# Patient Record
Sex: Female | Born: 1980 | Hispanic: Yes | Marital: Single | Smoking: Never smoker
Health system: Southern US, Community
[De-identification: ages and names within clinical notes are randomized; demographics above are authoritative.]

## PROBLEM LIST (undated history)

## (undated) ENCOUNTER — Inpatient Hospital Stay (HOSPITAL_COMMUNITY): Payer: Self-pay

## (undated) DIAGNOSIS — F419 Anxiety disorder, unspecified: Secondary | ICD-10-CM

## (undated) DIAGNOSIS — O4290 Premature rupture of membranes, unspecified as to length of time between rupture and onset of labor, unspecified weeks of gestation: Secondary | ICD-10-CM

## (undated) DIAGNOSIS — E079 Disorder of thyroid, unspecified: Secondary | ICD-10-CM

## (undated) DIAGNOSIS — R87619 Unspecified abnormal cytological findings in specimens from cervix uteri: Secondary | ICD-10-CM

## (undated) DIAGNOSIS — IMO0002 Reserved for concepts with insufficient information to code with codable children: Secondary | ICD-10-CM

## (undated) DIAGNOSIS — O09299 Supervision of pregnancy with other poor reproductive or obstetric history, unspecified trimester: Secondary | ICD-10-CM

## (undated) HISTORY — DX: Unspecified abnormal cytological findings in specimens from cervix uteri: R87.619

## (undated) HISTORY — DX: Premature rupture of membranes, unspecified as to length of time between rupture and onset of labor, unspecified weeks of gestation: O42.90

## (undated) HISTORY — DX: Reserved for concepts with insufficient information to code with codable children: IMO0002

## (undated) HISTORY — PX: WISDOM TOOTH EXTRACTION: SHX21

## (undated) HISTORY — DX: Supervision of pregnancy with other poor reproductive or obstetric history, unspecified trimester: O09.299

## (undated) HISTORY — PX: OTHER SURGICAL HISTORY: SHX169

## (undated) HISTORY — DX: Disorder of thyroid, unspecified: E07.9

---

## 2002-09-23 ENCOUNTER — Ambulatory Visit (HOSPITAL_COMMUNITY): Admission: RE | Admit: 2002-09-23 | Discharge: 2002-09-23 | Payer: Self-pay | Admitting: *Deleted

## 2002-10-03 DIAGNOSIS — O4290 Premature rupture of membranes, unspecified as to length of time between rupture and onset of labor, unspecified weeks of gestation: Secondary | ICD-10-CM

## 2002-10-03 DIAGNOSIS — O09299 Supervision of pregnancy with other poor reproductive or obstetric history, unspecified trimester: Secondary | ICD-10-CM

## 2002-10-03 HISTORY — DX: Supervision of pregnancy with other poor reproductive or obstetric history, unspecified trimester: O09.299

## 2002-10-03 HISTORY — DX: Premature rupture of membranes, unspecified as to length of time between rupture and onset of labor, unspecified weeks of gestation: O42.90

## 2002-11-14 ENCOUNTER — Inpatient Hospital Stay (HOSPITAL_COMMUNITY): Admission: AD | Admit: 2002-11-14 | Discharge: 2002-11-14 | Payer: Self-pay | Admitting: *Deleted

## 2002-12-31 ENCOUNTER — Ambulatory Visit (HOSPITAL_COMMUNITY): Admission: RE | Admit: 2002-12-31 | Discharge: 2002-12-31 | Payer: Self-pay | Admitting: *Deleted

## 2003-01-02 ENCOUNTER — Inpatient Hospital Stay (HOSPITAL_COMMUNITY): Admission: AD | Admit: 2003-01-02 | Discharge: 2003-01-02 | Payer: Self-pay | Admitting: Family Medicine

## 2003-01-23 ENCOUNTER — Observation Stay (HOSPITAL_COMMUNITY): Admission: AD | Admit: 2003-01-23 | Discharge: 2003-01-23 | Payer: Self-pay | Admitting: *Deleted

## 2003-01-24 ENCOUNTER — Encounter: Payer: Self-pay | Admitting: Family Medicine

## 2003-01-24 ENCOUNTER — Inpatient Hospital Stay (HOSPITAL_COMMUNITY): Admission: AD | Admit: 2003-01-24 | Discharge: 2003-01-24 | Payer: Self-pay | Admitting: Obstetrics and Gynecology

## 2003-01-27 ENCOUNTER — Inpatient Hospital Stay (HOSPITAL_COMMUNITY): Admission: AD | Admit: 2003-01-27 | Discharge: 2003-01-27 | Payer: Self-pay | Admitting: Obstetrics and Gynecology

## 2003-01-29 ENCOUNTER — Inpatient Hospital Stay (HOSPITAL_COMMUNITY): Admission: AD | Admit: 2003-01-29 | Discharge: 2003-02-03 | Payer: Self-pay | Admitting: Obstetrics and Gynecology

## 2003-01-29 ENCOUNTER — Encounter: Admission: RE | Admit: 2003-01-29 | Discharge: 2003-01-29 | Payer: Self-pay | Admitting: *Deleted

## 2003-02-05 ENCOUNTER — Encounter (INDEPENDENT_AMBULATORY_CARE_PROVIDER_SITE_OTHER): Payer: Self-pay | Admitting: *Deleted

## 2003-02-05 ENCOUNTER — Observation Stay (HOSPITAL_COMMUNITY): Admission: AD | Admit: 2003-02-05 | Discharge: 2003-02-06 | Payer: Self-pay | Admitting: *Deleted

## 2003-02-18 ENCOUNTER — Encounter: Admission: RE | Admit: 2003-02-18 | Discharge: 2003-02-18 | Payer: Self-pay | Admitting: Obstetrics and Gynecology

## 2003-03-29 ENCOUNTER — Emergency Department (HOSPITAL_COMMUNITY): Admission: EM | Admit: 2003-03-29 | Discharge: 2003-03-29 | Payer: Self-pay | Admitting: Emergency Medicine

## 2003-04-04 ENCOUNTER — Inpatient Hospital Stay (HOSPITAL_COMMUNITY): Admission: AD | Admit: 2003-04-04 | Discharge: 2003-04-04 | Payer: Self-pay | Admitting: Family Medicine

## 2004-10-01 ENCOUNTER — Inpatient Hospital Stay (HOSPITAL_COMMUNITY): Admission: AD | Admit: 2004-10-01 | Discharge: 2004-10-01 | Payer: Self-pay | Admitting: Obstetrics and Gynecology

## 2005-02-04 ENCOUNTER — Inpatient Hospital Stay (HOSPITAL_COMMUNITY): Admission: AD | Admit: 2005-02-04 | Discharge: 2005-02-04 | Payer: Self-pay | Admitting: Obstetrics and Gynecology

## 2005-02-17 ENCOUNTER — Inpatient Hospital Stay (HOSPITAL_COMMUNITY): Admission: AD | Admit: 2005-02-17 | Discharge: 2005-02-19 | Payer: Self-pay | Admitting: Obstetrics & Gynecology

## 2005-03-22 ENCOUNTER — Other Ambulatory Visit: Admission: RE | Admit: 2005-03-22 | Discharge: 2005-03-22 | Payer: Self-pay | Admitting: Obstetrics & Gynecology

## 2008-09-04 ENCOUNTER — Emergency Department (HOSPITAL_COMMUNITY): Admission: EM | Admit: 2008-09-04 | Discharge: 2008-09-04 | Payer: Self-pay | Admitting: Family Medicine

## 2009-08-22 ENCOUNTER — Inpatient Hospital Stay (HOSPITAL_COMMUNITY): Admission: AD | Admit: 2009-08-22 | Discharge: 2009-08-23 | Payer: Self-pay | Admitting: Obstetrics & Gynecology

## 2010-01-06 ENCOUNTER — Ambulatory Visit: Payer: Self-pay | Admitting: Obstetrics & Gynecology

## 2010-01-08 ENCOUNTER — Ambulatory Visit (HOSPITAL_COMMUNITY): Admission: RE | Admit: 2010-01-08 | Discharge: 2010-01-08 | Payer: Self-pay | Admitting: Family Medicine

## 2010-01-27 ENCOUNTER — Ambulatory Visit: Payer: Self-pay | Admitting: Obstetrics & Gynecology

## 2010-05-03 ENCOUNTER — Ambulatory Visit: Payer: Self-pay | Admitting: Internal Medicine

## 2010-05-14 ENCOUNTER — Encounter (INDEPENDENT_AMBULATORY_CARE_PROVIDER_SITE_OTHER): Payer: Self-pay | Admitting: Family Medicine

## 2010-05-14 ENCOUNTER — Ambulatory Visit: Payer: Self-pay | Admitting: Internal Medicine

## 2010-05-14 LAB — CONVERTED CEMR LAB
ALT: 15 units/L (ref 0–35)
Albumin: 4.6 g/dL (ref 3.5–5.2)
Basophils Absolute: 0 10*3/uL (ref 0.0–0.1)
CO2: 27 meq/L (ref 19–32)
Cholesterol: 186 mg/dL (ref 0–200)
Eosinophils Relative: 2 % (ref 0–5)
Glucose, Bld: 85 mg/dL (ref 70–99)
HCT: 41 % (ref 36.0–46.0)
LDL Cholesterol: 109 mg/dL — ABNORMAL HIGH (ref 0–99)
Lymphocytes Relative: 37 % (ref 12–46)
Lymphs Abs: 1.2 10*3/uL (ref 0.7–4.0)
Neutro Abs: 1.7 10*3/uL (ref 1.7–7.7)
Neutrophils Relative %: 51 % (ref 43–77)
Platelets: 255 10*3/uL (ref 150–400)
Potassium: 4.7 meq/L (ref 3.5–5.3)
RDW: 14.5 % (ref 11.5–15.5)
Sodium: 141 meq/L (ref 135–145)
Total Bilirubin: 0.4 mg/dL (ref 0.3–1.2)
Total Protein: 7.6 g/dL (ref 6.0–8.3)
Triglycerides: 49 mg/dL (ref <150)
VLDL: 10 mg/dL (ref 0–40)
WBC: 3.4 10*3/uL — ABNORMAL LOW (ref 4.0–10.5)

## 2010-08-20 ENCOUNTER — Ambulatory Visit: Payer: Self-pay | Admitting: Obstetrics and Gynecology

## 2010-10-24 ENCOUNTER — Encounter: Payer: Self-pay | Admitting: Obstetrics & Gynecology

## 2010-12-14 LAB — POCT URINALYSIS DIPSTICK
Bilirubin Urine: NEGATIVE
Glucose, UA: NEGATIVE mg/dL
Nitrite: NEGATIVE

## 2010-12-29 ENCOUNTER — Other Ambulatory Visit: Payer: Self-pay

## 2010-12-29 ENCOUNTER — Other Ambulatory Visit: Payer: Self-pay | Admitting: Obstetrics and Gynecology

## 2010-12-29 DIAGNOSIS — Z0189 Encounter for other specified special examinations: Secondary | ICD-10-CM

## 2010-12-29 LAB — POCT URINALYSIS DIP (DEVICE)
Bilirubin Urine: NEGATIVE
Ketones, ur: NEGATIVE mg/dL
Nitrite: NEGATIVE
Specific Gravity, Urine: 1.025 (ref 1.005–1.030)

## 2010-12-30 ENCOUNTER — Encounter: Payer: Self-pay | Admitting: *Deleted

## 2011-01-05 LAB — URINALYSIS, ROUTINE W REFLEX MICROSCOPIC
Bilirubin Urine: NEGATIVE
Hgb urine dipstick: NEGATIVE
Ketones, ur: NEGATIVE mg/dL
Protein, ur: NEGATIVE mg/dL
Urobilinogen, UA: 1 mg/dL (ref 0.0–1.0)

## 2011-01-05 LAB — CBC
HCT: 36.8 % (ref 36.0–46.0)
Hemoglobin: 12.5 g/dL (ref 12.0–15.0)
MCHC: 34 g/dL (ref 30.0–36.0)
MCV: 90.5 fL (ref 78.0–100.0)
Platelets: 204 10*3/uL (ref 150–400)
RDW: 13.6 % (ref 11.5–15.5)

## 2011-01-05 LAB — WET PREP, GENITAL
Clue Cells Wet Prep HPF POC: NONE SEEN
Trich, Wet Prep: NONE SEEN

## 2011-01-12 ENCOUNTER — Ambulatory Visit: Payer: Self-pay | Admitting: Obstetrics and Gynecology

## 2011-01-31 ENCOUNTER — Ambulatory Visit (INDEPENDENT_AMBULATORY_CARE_PROVIDER_SITE_OTHER): Payer: Self-pay | Admitting: Physician Assistant

## 2011-01-31 DIAGNOSIS — Z113 Encounter for screening for infections with a predominantly sexual mode of transmission: Secondary | ICD-10-CM

## 2011-02-01 NOTE — Group Therapy Note (Signed)
Kathleen Ward, Kathleen Ward NO.:  [id number]  MEDICAL RECORD NO.:  [id number]           PATIENT TYPE:  A  LOCATION:  [facility]                   FACILITY:  [facility]  PHYSICIAN:  Maylon Cos, CNM    DATE OF BIRTH:  [birth year]  DATE OF SERVICE:  01/31/2011                                 CLINIC NOTE  The patient is seen at [facility] at [facility]  Reason for today's visit is pelvic pain.  HISTORY OF PRESENT ILLNESS:  The patient is a 30 year old [place]- speaking Hispanic female who presents for followup secondary to she had recently called and presented to give urine specimen due to burning with urination and bleeding after voiding, specimen was collected on December 29, 2010, however, urine culture came back negative.  She was presumptively treated at the time of her visit with Bactrim.  She states that after 5 days of Bactrim her symptoms completely resolved and she is not having any pain or abnormal bleeding since.  She presents today without complaint.  Upon further examination and questioning, the patient does desire screening for sexually transmitted infections.  PHYSICAL EXAMINATION:  GENERAL:  Ms. Zuleta is a pleasant 30 year old Hispanic female in no apparent stress. HEENT:  Grossly normal with the exception of facial acne. VITAL SIGNS:  Temperature is 97.6, pulse is 74, blood pressure is 115/83, her weight is 120.6.  Her height is 60 inches. ABDOMEN:  Soft and nontender. GENITALIA:  She has a shaved perineum without lesions.  Mucous membranes are pink.  She has a scant amount of tanned pink tinged discharge noted in the vault watering without odor.  Cervix is nonfriable.  Bimanual exam reveals nonenlarged, nontender uterus and not enlarged, nontender adnexa.  No inguinal lymphadenopathy is noted. LOWER EXTREMITIES:  Without edema.  ASSESSMENT: 1. Pelvic pain now resolved. 2. STI screening.  PLAN:  Gonorrhea, Chlamydia, and wet prep were obtained  along with blood work for hepatitis, RPR and HIV.  The patient is instructed a clinic follow up plan of no news is good news, she can call for her own results after Wednesday if she desires.  The patient should return in December 2012 for her annual exam.  The patient verbalizes understanding and agreement in our plan.         ______________________________ Maylon Cos, CNM   SS/MEDQ  D:  01/31/2011  T:  02/01/2011  Job:  [id number]

## 2011-02-18 NOTE — Discharge Summary (Signed)
NAMEMEGGIN, OLA NO.:  [id number]   MEDICAL RECORD NO.:  [id number]          PATIENT TYPE:  INP   LOCATION:  9111                          [facility]ILITY[facility]   PHYSICIAN:  Malva Limes, M.D.    DATE OF BIRTH:  August 01, 1981   DATE OF ADMISSION:  02/17/2005  DATE OF DISCHARGE:  02/19/2005                                 DISCHARGE SUMMARY   DISCHARGE DIAGNOSES:  1.  Intrauterine pregnancy at 38-3/[redacted] weeks gestation.  2.  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  3.  Vacuum assisted vaginal delivery of a female infant with Apgars of 9 and      9.   HISTORY OF PRESENT ILLNESS:  This 30 year old gravida 2, para 1, presents to  [facility] in active labor.  The patient's antepartum course to this  point had been complicated by a 19-week ultrasound which showed absence of  the left kidney on fetal ultrasound, otherwise, her antepartum had been  uncomplicated.  She did have a negative Group B Strep culture obtained in  the office at 35 weeks.   HOSPITAL COURSE:  She is admitted at this point.  The patient dilated to  complete and complete. She had a vacuum assisted vaginal delivery of a 7  pound 0 ounce female infant with Apgars of 9 and 9.  Delivery went without  complications.  The patient's postpartum course was benign without  significant fevers.  She did have her little boy circumcised before  discharge.  She was felt ready for discharge on postpartum day #2, was sent  home on a regular diet, told to decrease activities, told to continue  prenatal vitamins.  Was given Percocet one to two every 4 hours as needed  for pain.  She was to follow up in the office in 4 weeks, of course to call  with any increased bleeding, fever, or pain.   DISCHARGE LABORATORY DATA:  The patient had a hemoglobin of 10.8, white  blood cell count of 9.4, and platelets of 225,000.      Belenda Cruise   MB/MEDQ  D:  03/21/2005  T:   03/21/2005  Job:  [id number]

## 2011-02-18 NOTE — Op Note (Signed)
   [name], Ward NO.:  [id number]   MEDICAL RECORD NO.:  [id number]                   PATIENT TYPE:  OBV   LOCATION:  9327                                 [facility]   PHYSICIAN:  Phil D. Okey Dupre, M.D.                  DATE OF BIRTH:  1981/02/03   DATE OF PROCEDURE:  02/05/2003  DATE OF DISCHARGE:                                 OPERATIVE REPORT   PROCEDURE:  Dilatation and curettage.   SURGEON:  Javier Glazier. Okey Dupre, M.D.   PREOPERATIVE DIAGNOSES:  Delayed postpartum hemorrhage.   POSTOPERATIVE DIAGNOSES:  Probable subinvolution of the placental site.   PROCEDURE:  Under satisfactory sedation with the patient in a dorsal  lithotomy position the perineum and vagina were prepped and draped in the  usual sterile manner.  Bimanual pelvic examination revealed a firm, well  involuted uterus to approximately [redacted] weeks gestational size.  A weighted  speculum was placed in the posterior fourchette of the vagina.  Anterior lip  of the cervix grasped with the ring forceps and the uterine cavity sounded  to a depth of 15 cm.  Cervical os was already dilated up beyond the number  10 Hegar dilator.  Uterine cavity was explored with a ring forceps followed  by curettage with a large sharp curette and a suction curette.  A lot of  blood and clots were removed from the cavity; however, no large pieces of  retained secundines could be noted but there was a significant decrease in  the bleeding once this was done.  The speculum was removed and a ring  forceps removed from the vagina and shortly thereafter it was noted at the  fourchette of the vagina at the episiotomy site there was a significant ooze  where the weighted speculum had been on the episiotomy and a figure-of-eight  suture was used to control this bleeding without incident.  The total blood  loss during the procedure was about 200 mL.  The patient tolerated the  procedure well and was transferred to  recovery room.  Prior to the procedure  10 mL of Xylocaine was injected into each of the paracervical areas at 8 and  4 o'clock as a paracervical block.                                                 Phil D. Okey Dupre, M.D.    PDR/MEDQ  D:  02/05/2003  T:  02/05/2003  Job:  [id number]

## 2011-02-18 NOTE — Discharge Summary (Signed)
Kathleen Ward, Kathleen Ward   MEDICAL RECORD NO.:  [id number]                   PATIENT TYPE:  INP   LOCATION:  9303                                 [facility]   PHYSICIAN:  Barney Drain, M.D.                 DATE OF BIRTH:  03-26-1981   DATE OF ADMISSION:  01/29/2003  DATE OF DISCHARGE:  02/03/2003                                 DISCHARGE SUMMARY   CONSULTANTS:  Dorinda Hill T. Pamalee Leyden, M.D., anesthesiology.   DISCHARGE DIAGNOSES:  1. Term female delivered by vacuum assisted vaginal delivery.  2. Pregnancy induced hypertension.   DISCHARGE MEDICATIONS:  1. Micronor - patient instructed to begin taking in two weeks to use while     breast feeding.  2. Prenatal vitamins one tablet daily while breast feeding.  3. Ibuprofen 600 mg q.6h p.r.n. for discomfort.   SPECIAL INSTRUCTIONS:  No intercourse for six weeks.   DISPOSITION:  The patient was discharged home in stable condition.   FOLLOW UP:  The patient to return in [facility] in six weeks.   HISTORY AND PHYSICAL:  Kathleen Ward is a 30 year old G2, P0, 0, 1, 0 who  presented at 38-5/7 weeks based on 20-week ultrasound.  She was referred  from [facility] for induction of labor for pregnancy induced  hypertension.  The patient denied any symptoms of preeclampsia.  VITAL SIGNS: Blood pressure 135/79.  GENERAL: The patient was comfortable.  NEUROLOGIC EXAM: Nonfocal with no clonus.  EXTREMITIES: Trace lower extremity edema.  DIGITAL CERVICAL EXAM: Was 2 cm, 50% effaced, -1 to 2.  ABDOMEN: Fetal heart rate was 140 beats per minute with a reactive strip and  good variability.  No decelerations.  The patient was contracting every one  to three minutes of mild intensity.   PERTINENT LABORATORY DATA:  Ultrasound from 01/29/03 with a biophysical  profile score of 8/8 with AFI of 5.9 and vertex position.  Uric acid was 7.2  on 4/26.  LDH was 169 on 4/26.   HOSPITAL COURSE:  PROBLEM  1.  Intrauterine pregnancy.  The patient's labor  was induced with Cervidil and augmented with Pitocin.  At 1913 Hr. On  01/31/03 the patient delivered a viable female infant by vacuum assisted  vaginal delivery with Apgar's of 8 and 9 under epidural anesthesia.  A  midline episiotomy with a second degree laceration was repaired with 3-0  suture.  The patient's postpartum course was only complicated by some right  thigh numbness.  Anesthesiology was consulted, Dr. Pamalee Leyden evaluated the  patient and felt that this was a benign neuralgia secondary to pregnancy  induced edema.  PROBLEM 2.  Term female infant.  The patient plans to breast feed, lactation  consultant works with patient prior to discharge.  PROBLEM 3.  Family planning.  The patient to use Micronor while breast  feeding.  PROBLEM 4.  Pregnancy induced hypertension.  The patient was begun on  magnesium on 01/31/03, was weaned off of magnesium on 02/02/03.  Blood  pressures remained within normal range. Labs were negative at time of  discharge and PIH resolved at time of discharge.                                               Barney Drain, M.D.    SS/MEDQ  D:  02/03/2003  T:  02/04/2003  Job:  [id number]

## 2011-03-05 ENCOUNTER — Ambulatory Visit (INDEPENDENT_AMBULATORY_CARE_PROVIDER_SITE_OTHER): Payer: Self-pay

## 2011-03-05 ENCOUNTER — Inpatient Hospital Stay (INDEPENDENT_AMBULATORY_CARE_PROVIDER_SITE_OTHER)
Admission: RE | Admit: 2011-03-05 | Discharge: 2011-03-05 | Disposition: A | Discharge status: Home / Self Care | Payer: Self-pay | Source: Ambulatory Visit | Attending: Family Medicine | Admitting: Family Medicine

## 2011-03-05 DIAGNOSIS — K5900 Constipation, unspecified: Secondary | ICD-10-CM

## 2011-03-05 LAB — OCCULT BLOOD, POC DEVICE: Fecal Occult Bld: POSITIVE

## 2011-03-05 LAB — POCT PREGNANCY, URINE: Preg Test, Ur: NEGATIVE

## 2011-03-05 LAB — POCT URINALYSIS DIP (DEVICE)
Bilirubin Urine: NEGATIVE
Ketones, ur: NEGATIVE mg/dL
Nitrite: NEGATIVE

## 2011-03-07 LAB — OCCULT BLOOD, POC DEVICE

## 2011-08-17 ENCOUNTER — Encounter: Payer: Self-pay | Admitting: *Deleted

## 2011-08-17 DIAGNOSIS — R87612 Low grade squamous intraepithelial lesion on cytologic smear of cervix (LGSIL): Secondary | ICD-10-CM

## 2011-08-19 ENCOUNTER — Ambulatory Visit: Payer: Self-pay | Admitting: Obstetrics & Gynecology

## 2011-08-19 ENCOUNTER — Ambulatory Visit (INDEPENDENT_AMBULATORY_CARE_PROVIDER_SITE_OTHER): Payer: Self-pay | Admitting: Advanced Practice Midwife

## 2011-08-19 ENCOUNTER — Encounter (HOSPITAL_COMMUNITY): Payer: Self-pay | Admitting: *Deleted

## 2011-08-19 ENCOUNTER — Encounter: Payer: Self-pay | Admitting: Advanced Practice Midwife

## 2011-08-19 ENCOUNTER — Inpatient Hospital Stay (HOSPITAL_COMMUNITY)
Admission: AD | Admit: 2011-08-19 | Discharge: 2011-08-19 | Disposition: A | Discharge status: Home / Self Care | Payer: Self-pay | Source: Ambulatory Visit | Attending: Obstetrics and Gynecology | Admitting: Obstetrics and Gynecology

## 2011-08-19 ENCOUNTER — Inpatient Hospital Stay (HOSPITAL_COMMUNITY): Payer: Self-pay

## 2011-08-19 DIAGNOSIS — O209 Hemorrhage in early pregnancy, unspecified: Secondary | ICD-10-CM

## 2011-08-19 DIAGNOSIS — Z3200 Encounter for pregnancy test, result unknown: Secondary | ICD-10-CM

## 2011-08-19 DIAGNOSIS — Z331 Pregnant state, incidental: Secondary | ICD-10-CM

## 2011-08-19 DIAGNOSIS — Z01419 Encounter for gynecological examination (general) (routine) without abnormal findings: Secondary | ICD-10-CM

## 2011-08-19 HISTORY — DX: Anxiety disorder, unspecified: F41.9

## 2011-08-19 LAB — URINALYSIS, ROUTINE W REFLEX MICROSCOPIC
Glucose, UA: NEGATIVE mg/dL
Leukocytes, UA: NEGATIVE
Protein, ur: NEGATIVE mg/dL
Specific Gravity, Urine: 1.025 (ref 1.005–1.030)
Urobilinogen, UA: 1 mg/dL (ref 0.0–1.0)

## 2011-08-19 LAB — URINE MICROSCOPIC-ADD ON

## 2011-08-19 LAB — CBC
Hemoglobin: 12.9 g/dL (ref 12.0–15.0)
MCH: 31.5 pg (ref 26.0–34.0)
MCHC: 34.3 g/dL (ref 30.0–36.0)
MCV: 91.7 fL (ref 78.0–100.0)
RBC: 4.1 MIL/uL (ref 3.87–5.11)

## 2011-08-19 NOTE — Patient Instructions (Signed)
Vaginal Bleeding During Pregnancy, First Trimester A small amount of bleeding (spotting) is relatively common in early pregnancy. It usually stops on its own. There are many causes for bleeding or spotting in early pregnancy. Some bleeding may be related to the pregnancy and some may not. Cramping with the bleeding is more serious and concerning. Tell your caregiver if you have any vaginal bleeding.  CAUSES   It is normal in most cases.   The pregnancy ends (miscarriage).   The pregnancy may end (threatened miscarriage).   Infection or inflammation of the cervix.   Growths (polyps) on the cervix.   Pregnancy happens outside of the uterus and in a fallopian tube (tubal pregnancy).   Many tiny cysts in the uterus instead of pregnancy tissue (molar pregnancy).  SYMPTOMS  Vaginal bleeding or spotting with or without cramps. DIAGNOSIS  To evaluate the pregnancy, your caregiver may:  Do a pelvic exam.   Take blood tests.   Do an ultrasound.  It is very important to follow your caregiver's instructions.  TREATMENT   Evaluation of the pregnancy with blood tests and ultrasound.   Bed rest (getting up to use the bathroom only).   Rho-gam immunization if the mother is Rh negative and the father is Rh positive.  HOME CARE INSTRUCTIONS   If your caregiver orders bed rest, you may need to make arrangements for the care of other children and for other responsibilities. However, your caregiver may allow you to continue light activity.   Keep track of the number of pads you use each day, how often you change pads and how soaked (saturated) they are. Write this down.   Do not use tampons. Do not douche.   Do not have sexual intercourse or orgasms until approved by your physician.   Save any tissue that you pass for your caregiver to see.   Take medicine for cramps only with your caregiver's permission.   Do not take aspirin because it can make you bleed.  SEEK IMMEDIATE MEDICAL CARE  IF:   You experience severe cramps in your stomach, back or belly (abdomen).   You have an oral temperature above 102 F (38.9 C), not controlled by medicine.   You pass large clots or tissue.   Your bleeding increases or you become light-headed, weak or have fainting episodes.   You develop chills.   You are leaking or have a gush of fluid from your vagina.   You pass out while having a bowel movement. That may mean you have a ruptured tubal pregnancy.  Document Released: 06/29/2005 Document Revised: 06/01/2011 Document Reviewed: 01/08/2009 [facility] Patient Information 2012 [place], [place].

## 2011-08-19 NOTE — ED Provider Notes (Signed)
History     Chief Complaint  Patient presents with  . Vaginal Bleeding   HPI  Kathleen Ward is 30 y.o. [id number] [redacted]w[redacted]d weeks presents after being seen in the clinic this am. Patient's last menstrual period was 07/08/2011. Had positive pregnancy test.    Orders put in while in clinic by M. Williams and sent here to r/o ectopic.  States she had bleeding like the first day of a period yesterday but it has now stopped.  Denies pain.  Past Medical History  Diagnosis Date  . Abnormal Pap smear   . Thyroid disease   . Anxiety     Past Surgical History  Procedure Date  . Wisdom tooth extraction     Family History  Problem Relation Age of Onset  . Hyperlipidemia Father     History  Substance Use Topics  . Smoking status: Never Smoker   . Smokeless tobacco: Not on file  . Alcohol Use: No    Allergies: No Known Allergies  Prescriptions prior to admission  Medication Sig Dispense Refill  . Multiple Vitamin (MULTIVITAMIN) capsule Take 1 capsule by mouth daily. Pt just started         ROS Physical Exam   Blood pressure 123/83, pulse 80, temperature 98.3 F (36.8 C), temperature source Oral, resp. rate 20, last menstrual period 07/08/2011.  Physical Exam-- done just prior to admission in MAU by M. WIlliams, CNM ( in clinic)  Not repeated  Results for orders placed during the hospital encounter of 08/19/11 (from the past 24 hour(s))  CBC     Status: Normal   Collection Time   08/19/11  9:55 AM      Component Value Range   WBC 6.0  4.0 - 10.5 (K/uL)   RBC 4.10  3.87 - 5.11 (MIL/uL)   Hemoglobin 12.9  12.0 - 15.0 (g/dL)   HCT 45.4  09.8 - 11.9 (%)   MCV 91.7  78.0 - 100.0 (fL)   MCH 31.5  26.0 - 34.0 (pg)   MCHC 34.3  30.0 - 36.0 (g/dL)   RDW 14.7  82.9 - 56.2 (%)   Platelets 190  150 - 400 (K/uL)  HCG, QUANTITATIVE, PREGNANCY     Status: Abnormal   Collection Time   08/19/11  9:55 AM      Component Value Range   hCG, Beta Chain, Quant, S 42352 (*) <5 (mIU/mL)    URINALYSIS, ROUTINE W REFLEX MICROSCOPIC     Status: Abnormal   Collection Time   08/19/11 10:04 AM      Component Value Range   Color, Urine YELLOW  YELLOW    Appearance CLEAR  CLEAR    Specific Gravity, Urine 1.025  1.005 - 1.030    pH 6.5  5.0 - 8.0    Glucose, UA NEGATIVE  NEGATIVE (mg/dL)   Hgb urine dipstick TRACE (*) NEGATIVE    Bilirubin Urine NEGATIVE  NEGATIVE    Ketones, ur 40 (*) NEGATIVE (mg/dL)   Protein, ur NEGATIVE  NEGATIVE (mg/dL)   Urobilinogen, UA 1.0  0.0 - 1.0 (mg/dL)   Nitrite NEGATIVE  NEGATIVE    Leukocytes, UA NEGATIVE  NEGATIVE   URINE MICROSCOPIC-ADD ON     Status: Normal   Collection Time   08/19/11 10:04 AM      Component Value Range   Squamous Epithelial / LPF RARE  RARE    WBC, UA 0-2  <3 (WBC/hpf)   RBC / HPF 0-2  <3 (RBC/hpf)  Bacteria, UA RARE  RARE    Urine-Other MUCOUS PRESENT       From Previous record patient's blood type is O POSITIVE   Per M. Williams CNM cultures were obtained in the clinic.   MAU Course  Procedures       Assessment and Plan  A: Intrauterine pregnancy at [redacted]w[redacted]d by Ultrasound     Small subchorionic hemorrhage  P:  Instructed patient to begin prenatal care with doctor of her choice.   KEY,EVE M 08/19/2011, 10:02 AM   Matt Holmes, NP 08/19/11 1147

## 2011-08-19 NOTE — Progress Notes (Signed)
Last wk had pain lower left, this week pain lower rt, currently no abd pain, just aching in mid to low back

## 2011-08-19 NOTE — Progress Notes (Signed)
  Subjective:    Patient ID: Kathleen Ward, female    DOB: Feb 27, 1981, 30 y.o.   MRN: [id number]  HPI 30 y.o. G5 [id number] at Unknown GA with LMP 07/08/11 presents for annual exam. Uses condoms for contraception, and suspects she is pregnant. Has had spotting yesterday and back pain.     Review of Systems  Constitutional: Negative for activity change.  Genitourinary: Positive for vaginal bleeding and pelvic pain. Negative for dysuria.       Objective:   Physical Exam  Constitutional: She is oriented to person, place, and time. She appears well-developed and well-nourished.  HENT:  Head: Normocephalic.  Cardiovascular: Normal rate.   Pulmonary/Chest: Effort normal.  Abdominal: Soft. She exhibits no distension and no mass. There is no tenderness. There is no rebound and no guarding.  Genitourinary: Vagina normal and uterus normal. No vaginal discharge found.       Uterus 4-6wk size, no blood, adnexae nontender, pap and cultures done.  Musculoskeletal: Normal range of motion.  Neurological: She is alert and oriented to person, place, and time.  Skin: Skin is warm and dry.  Psychiatric: She has a normal mood and affect.          Assessment & Plan:  A:  Annual Exam      Pregnant, incidental     Pain and bleeding, R/O ectopic P:  Will send to MAU for Quant and [place]      Pt agreeable.

## 2011-08-19 NOTE — Progress Notes (Signed)
Rtn/annual exam today at clinic. Presented with abd pain (2 wks), bleeding started as spotting last wk. Test in clinic confirmed preg.

## 2011-08-25 NOTE — ED Provider Notes (Signed)
Agree with above note.  Kathleen Ward 08/25/2011 8:25 PM

## 2011-09-14 ENCOUNTER — Ambulatory Visit: Payer: Self-pay | Admitting: Advanced Practice Midwife

## 2011-10-07 ENCOUNTER — Encounter: Payer: Self-pay | Admitting: Obstetrics & Gynecology

## 2011-10-07 ENCOUNTER — Ambulatory Visit (INDEPENDENT_AMBULATORY_CARE_PROVIDER_SITE_OTHER): Payer: Self-pay | Admitting: Obstetrics & Gynecology

## 2011-10-07 VITALS — BP 123/82 | HR 73 | Temp 97.2°F | Ht 61.0 in | Wt 122.4 lb

## 2011-10-07 DIAGNOSIS — Z309 Encounter for contraceptive management, unspecified: Secondary | ICD-10-CM

## 2011-10-07 NOTE — Patient Instructions (Signed)
Oral Contraceptives Oral contraceptives (OCs) are medicines taken to prevent pregnancy. They are the most widely used method of birth control. OCs work by preventing the ovaries from releasing eggs. The OC hormones also cause the mucus on the cervix to thicken, preventing the sperm from entering the uterus. They also cause the lining of the uterus to become thin, not allowing a fertilized egg to attach to the inside of the uterus. OCs have a failure rate of less than 1%, when taken exactly as prescribed. THERE ARE 2 TYPES OF OC  OC that contains a mix of estrogen and progesterone hormones is the most common OC used. It is taken for 21 days, followed by 7 days of not taking the OC hormones. It can be packaged as 28 pills, with the last 7 pills being inactive. You take a pill every day. This way you do not need to remember when to restart taking the active pills. Most women will begin their menstrual period 2 to 3 days after taking the hormone pill. The menstrual period is usually lighter and shorter. This combination OC should not be taken if you are breast-feeding.   The progesterone only (minipill) OC does not contain estrogen. It is taken every day, continuously. You may have only spotting for a period, or no period at all. The progesterone only OC can be taken if you are breast-feeding your baby.  OCs come in:  Packs of 21 pills, with no pills to take for 7 days after the last pill.   Packs of 28 pills, with a pill to take every day. The last 7 pills are without hormones.   Packs of 91 pills (continuous or extended use), with a pill to take every day. The first 84 pills contain the hormones, and the last 7 pills do not. That is when you will have your menstrual period. You will not have a menstrual period during the time you are taking the first 84 pills.  HOW TO TAKE OC Your caregiver may advise you on how to start taking the first cycle of OCs. Otherwise, you can:  Start on day 1 or day 5 of  your menstrual period, taking the first pack of the OC. You will not need any backup contraceptive protection with this start time.   Start on the first Sunday after your menstrual period, day 7 of your menstrual period, or the day you get your prescription. In these cases, you will need backup contraceptive protection for the first cycle.  No matter which day you start the OC, you will always start a new pack on that same day of the week. It is a good idea to have an extra pack of OCs and a backup contraceptive method available, in case you miss some pills or lose your OC pack. COMMON REASONS FOR FAILURE   Forgetting to take the pill at the same time every day.   Poor absorption of the pill from the stomach into the bloodstream. This can be caused by diarrhea, vomiting, and the use of some medicines that kill germs (antibiotics).   Stomach or intestinal disease.   Taking OCs with other medicines that may make them less effective (carbamazepine, phenytoin, phenobarbital, rifampin).   Using OCs that have passed their expiration dates.   Forgetting to restart the pills on day 7, when using the packs of 21 pills.  If you forget to take 1 pill, take it as soon as you remember, and take the next pill at the   regular time. If you miss 2 or more pills, use backup birth control until your next menstrual period starts. Also, you may have vaginal spotting or bleeding when you miss 2 or more OC pills. If you use the pack of 28 pills or 91 pills, and you miss 1 of the last 7 pills (pills with no hormones), it will not matter. Just throw away the rest of the non-hormone pills and start a new 28 or 91 pill pack. COMMON USES OF OC  Decreasing premenstrual problems (symptoms).   Treating menstrual period cramps.   Avoiding becoming pregnant.   Regulating the menstrual cycle.   Treating acne.   Decreasing the heavy menstrual flow.   Treating dysfunctional (abnormal) uterine bleeding.   Treating  chronic pelvic pain.   Treating polycystic ovary syndrome (ovary does not ovulate and produces tiny cysts).   Treating endometriosis (uterus lining growing in the pelvis, tubes, and ovaries).   Can be used for emergency contraception.  OCs DO NOT prevent sexually transmitted diseases (STDs). Safer sex practices, such as using condoms along with the pill, can help prevent STDs.  BENEFITS  OC reduces the risk of:   Cancer of the ovary and uterus.   Ovarian cysts.   Pelvic infection.   Symptoms of polycystic ovary syndrome.   Loss of bone (osteoporosis).   Noncancerous (benign) breast disease (fibrocystic breast changes).   Lack of red blood cells (anemia) from heavy or long menstrual periods.   Pregnancy occurring outside the uterus (tubal pregnancy).   Acne.   Slows down the flow of heavy menstrual periods.   Sometimes helps control premenstrual syndrome (PMS).   Stops menstrual cramps and pain.   Controls irregular menstrual periods.   Can be used as emergency contraception.  YOU SHOULD NOT TAKE THE PILL IF YOU:  Are pregnant, or are trying to get pregnant.   Have unexplained or abnormal vaginal bleeding.   Have a history of liver disease, stroke, or heart attack.   Smoke.   Have a history of blood clots, cancer, or heart problems.   Have gallbladder disease.   Have breast cancer or suspect breast cancer.   Have or suspect pelvic cancer.   Have high blood pressure.   Have high cholesterol or high triglycerides.   Have mental depression.   Are breast-feeding, except for the progesterone only OC, with approval of your caregiver.   Have diabetes with kidney, eye, or other blood vessel complications. Or if you have diabetes for 20 years or more.   Have heart valve disease.   Have migraine headaches. They may get worse.  Before taking the pill, a woman will have a physical exam and Pap test. Your caregiver may order blood tests to check blood sugar and  cholesterol levels, and other blood tests that may be necessary. SIDE EFFECTS OF THE PILL MAY INCLUDE:  Breast tenderness, pain and discharge.   Change in sex drive (increased or decreased libido).   Depression.   Being tired often.   Headaches.   Anxiety.   Irregular spotting or vaginal bleeding for a couple of months.   Leg pain.   Cramps, or swelling of your limbs (extremities).   Mood swings.   Weight loss or weight gain.   Feeling sick to your stomach (nausea).   Change in appetite (hunger).   Loss of hair.   Yeast or fungus vaginal infection.   Nervousness.   Rash.   Acne.   No menstrual period (amenorrhea).  When   starting an OC, it is usually best to allow 2-3 months, if possible, for the body to adjust (before stopping because of side effects). This allows for adjustment to the changes in hormone levels. If a woman continues to have side effects, it may be possible to change to a different OC. It is important to discuss side effects with your caregiver. Often, changing to a different pill causes the side effects to subside. RISKS AND COMPLICATIONS   Blood clots of the leg, heart, lung, or brain.   High blood pressure.   Gallbladder disease.   Liver tumors.   Brain bleeding (hemorrhage).   Slight risk of breast cancer.  HOME CARE INSTRUCTIONS   Do not smoke.   Only take over-the-counter or prescription medicines for pain, discomfort, fever, or breast tenderness as directed by your caregiver.   Always use a condom to protect against sexually transmitted disease. OCs do not protect against STDs.   Keep a calendar, marking your menstrual period days.  Recommendations, types, and dosages of OC use change continually. Discuss your choices with your caregiver, and decide what is best for you. There are always exceptions to guidelines. You should always read the information that comes with the OC, and check whether there are any new recommendations or  guidelines. SEEK MEDICAL CARE IF:   You develop nausea and vomiting from the OC.   You have abnormal vaginal discharge.   You need treatment for headaches.   You develop a rash.   You miss your menstrual period.   You develop abnormal vaginal bleeding.   You are losing your hair.   You need treatment for mood swings or depression.   You get dizzy when taking the OC.   You develop acne from taking the OC.  SEEK IMMEDIATE MEDICAL CARE IF:   You develop leg pain.   You develop chest pain.   You develop shortness of breath.   You develop abdominal pain.   You have an uncontrolled headache.   You develop numbness or slurred speech.   You develop visual problems (loss of vision, double, or blurry vision).   You develop heavy vaginal bleeding.  If you are taking the pill, STOP RIGHT AWAY and CALL YOUR CAREGIVER IMMEDIATELY if the following occur:  You develop chest pain and shortness of breath.   You develop pain, redness, and swelling in the legs.   You develop severe headaches, visual changes, or belly (abdominal) pain.   You develop severe depression.   You become pregnant.  Document Released: 12/10/2002 Document Revised: 10/22/2010 Document Reviewed: 10/01/2009 Exit[employer] Patient Information 2012 [employer]. 

## 2011-10-07 NOTE — Progress Notes (Signed)
  Subjective:    Patient ID: Kathleen Ward, female    DOB: [birth year], 31 y.o.   MRN: [id number]  HPI  Ms. Hanel is a 31 yo SH [id number] who is here to discuss birth control. She had an elective termination at [facility] in November with pills. She was given OCPs then but says they make her throw up. She has tried them in the distant past but had difficulty remembering to take them. She is interested in the Mirena IUD. She has used condoms unsuccessfully in the past as well. After further discussion and after she read the literature about the Mirena, she wants to go back to OCPs, mainly because she wants to help with her acne.  Review of Systems Her pap was normal here at her annual exam 11/12 when pregnancy was diagnosed.    Objective:   Physical Exam        Assessment & Plan:  Birth control discussion-She wants OCPs. I will e-prescribe them. She will start them with her NMP and use condoms for 1 month after starting the pill. She realizes that if she doesn't take them regularly, then she will be fertile.

## 2012-10-03 NOTE — L&D Delivery Note (Signed)
Delivery Note  After a 40 minute 2nd stage, at 1816 a viable girl was delivered via  (Presentation: LOA).  APGAR:9/9  ; weight pending.  40 units of pitocin diluted in 1000cc LR was infused rapidly IV.  The placenta separated spontaneously and delivered via CCT and maternal pushing effort.  It was inspected and appears to be intact with a 3 VC.  There were the following complications:   Anesthesia: Epidural  Episiotomy: none Lacerations: 1st degree perineal Suture Repair: 2.0 vicryl Est. Blood Loss (mL): 200  Mom to postpartum.  Baby to Couplet care / Skin to Skin`.

## 2013-05-08 ENCOUNTER — Ambulatory Visit (INDEPENDENT_AMBULATORY_CARE_PROVIDER_SITE_OTHER): Payer: Self-pay | Admitting: *Deleted

## 2013-05-08 DIAGNOSIS — Z3201 Encounter for pregnancy test, result positive: Secondary | ICD-10-CM

## 2013-05-08 NOTE — Progress Notes (Signed)
Pt here for UPT- positive.  New Ob appt scheduled on 8/27.

## 2013-05-29 ENCOUNTER — Other Ambulatory Visit (HOSPITAL_COMMUNITY)
Admission: RE | Admit: 2013-05-29 | Discharge: 2013-05-29 | Disposition: A | Discharge status: Home / Self Care | Payer: Medicaid Other | Source: Ambulatory Visit | Attending: Family Medicine | Admitting: Family Medicine

## 2013-05-29 ENCOUNTER — Encounter: Payer: Self-pay | Admitting: Family Medicine

## 2013-05-29 ENCOUNTER — Ambulatory Visit (INDEPENDENT_AMBULATORY_CARE_PROVIDER_SITE_OTHER): Payer: Medicaid Other | Admitting: Family Medicine

## 2013-05-29 VITALS — BP 114/72 | Temp 97.2°F

## 2013-05-29 DIAGNOSIS — Z3481 Encounter for supervision of other normal pregnancy, first trimester: Secondary | ICD-10-CM

## 2013-05-29 DIAGNOSIS — Z113 Encounter for screening for infections with a predominantly sexual mode of transmission: Secondary | ICD-10-CM | POA: Insufficient documentation

## 2013-05-29 DIAGNOSIS — O0930 Supervision of pregnancy with insufficient antenatal care, unspecified trimester: Secondary | ICD-10-CM

## 2013-05-29 DIAGNOSIS — E039 Hypothyroidism, unspecified: Secondary | ICD-10-CM

## 2013-05-29 DIAGNOSIS — Z3483 Encounter for supervision of other normal pregnancy, third trimester: Secondary | ICD-10-CM

## 2013-05-29 DIAGNOSIS — Z3480 Encounter for supervision of other normal pregnancy, unspecified trimester: Secondary | ICD-10-CM | POA: Insufficient documentation

## 2013-05-29 DIAGNOSIS — Z01419 Encounter for gynecological examination (general) (routine) without abnormal findings: Secondary | ICD-10-CM | POA: Insufficient documentation

## 2013-05-29 DIAGNOSIS — O0933 Supervision of pregnancy with insufficient antenatal care, third trimester: Secondary | ICD-10-CM | POA: Insufficient documentation

## 2013-05-29 LAB — POCT URINALYSIS DIP (DEVICE)
Bilirubin Urine: NEGATIVE
Ketones, ur: NEGATIVE mg/dL
Leukocytes, UA: NEGATIVE
Protein, ur: NEGATIVE mg/dL
pH: 6.5 (ref 5.0–8.0)

## 2013-05-29 NOTE — Progress Notes (Signed)
  Subjective:    Kathleen Ward is being seen today for her first obstetrical visit. She is a late entry to prenatal care due to insurance. Applied for [facility] care and then was told needed medicaid and took this long to get approval. She was not clear that she could get her care here without insurance if need be.  This is not a planned pregnancy but she is excited. She is at [redacted]w[redacted]d gestation by a relatively certain LMP. Her obstetrical history is significant for late prenatal care. Relationship with FOB: significant other, not living together. Patient does intend to breast feed. Pregnancy history fully reviewed.  Patient reports no bleeding, no contractions, no cramping and no leaking. No fevers, chills, abd pain, nausea, vomiting, diarrhea, sob, chest pain.   Review of Systems:   Review of Systems See above Objective:     BP 114/72  Temp(Src) 97.2 F (36.2 C)  LMP 10/29/2012 Physical Exam  Exam BP 114/72  Temp(Src) 97.2 F (36.2 C)  LMP 10/29/2012 GENERAL: Well-developed, well-nourished female in no acute distress.  HEENT: Normocephalic, atraumatic. Sclerae anicteric.  NECK: Supple. Normal thyroid.  LUNGS: Clear to auscultation bilaterally.  HEART: Regular rate and rhythm. BREASTS: deferred ABDOMEN: Soft, nontender, nondistended. Gravid. FH 30cm.  PELVIC: Normal external female genitalia. Vagina is pink and rugated.  Normal discharge. Normal cervix contour. Pap smear obtained. No adnexal mass or tenderness.  EXTREMITIES: No cyanosis, clubbing, or edema, 2+ distal pulses.    Assessment:    Pregnancy: [id number] Patient Active Problem List   Diagnosis Date Noted  . Supervision of normal subsequent pregnancy 05/29/2013  . Pregnancy as incidental finding 08/19/2011  . 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 PAP done 1 hour glucola also done  Prenatal vitamins. Problem list reviewed  and updated. Hx of hypothyroid per report but with no medication. No recent tsh in system. Will check tsh and t4 given hx of hypothyroid.  Too late for genetic screening Role of ultrasound in pregnancy discussed; fetal survey: requested. Follow up in 2 weeks. 50% of 30 min visit spent on counseling and coordination of care.     Daya Dutt L 05/29/2013

## 2013-05-29 NOTE — Patient Instructions (Signed)

## 2013-05-29 NOTE — Progress Notes (Signed)
Pulse 107 Edema trace in right leg.

## 2013-05-30 ENCOUNTER — Encounter: Payer: Self-pay | Admitting: Family Medicine

## 2013-05-30 ENCOUNTER — Other Ambulatory Visit: Payer: Self-pay | Admitting: Family Medicine

## 2013-05-30 ENCOUNTER — Ambulatory Visit (HOSPITAL_COMMUNITY)
Admission: RE | Admit: 2013-05-30 | Discharge: 2013-05-30 | Disposition: A | Discharge status: Home / Self Care | Payer: Medicaid Other | Source: Ambulatory Visit | Attending: Family Medicine | Admitting: Family Medicine

## 2013-05-30 DIAGNOSIS — Z3689 Encounter for other specified antenatal screening: Secondary | ICD-10-CM | POA: Insufficient documentation

## 2013-05-30 DIAGNOSIS — O0930 Supervision of pregnancy with insufficient antenatal care, unspecified trimester: Secondary | ICD-10-CM | POA: Insufficient documentation

## 2013-05-30 DIAGNOSIS — O0933 Supervision of pregnancy with insufficient antenatal care, third trimester: Secondary | ICD-10-CM

## 2013-05-30 DIAGNOSIS — O09299 Supervision of pregnancy with other poor reproductive or obstetric history, unspecified trimester: Secondary | ICD-10-CM | POA: Insufficient documentation

## 2013-05-30 DIAGNOSIS — Z3481 Encounter for supervision of other normal pregnancy, first trimester: Secondary | ICD-10-CM

## 2013-05-30 DIAGNOSIS — Z3483 Encounter for supervision of other normal pregnancy, third trimester: Secondary | ICD-10-CM

## 2013-05-30 LAB — OBSTETRIC PANEL
Antibody Screen: NEGATIVE
Basophils Absolute: 0 10*3/uL (ref 0.0–0.1)
Basophils Relative: 0 % (ref 0–1)
Eosinophils Absolute: 0.1 10*3/uL (ref 0.0–0.7)
Eosinophils Relative: 1 % (ref 0–5)
HCT: 34.1 % — ABNORMAL LOW (ref 36.0–46.0)
Hemoglobin: 11.9 g/dL — ABNORMAL LOW (ref 12.0–15.0)
MCH: 32.8 pg (ref 26.0–34.0)
MCHC: 34.9 g/dL (ref 30.0–36.0)
Monocytes Absolute: 0.7 10*3/uL (ref 0.1–1.0)
Monocytes Relative: 10 % (ref 3–12)
RDW: 13.4 % (ref 11.5–15.5)
Rh Type: POSITIVE

## 2013-05-30 LAB — HIV ANTIBODY (ROUTINE TESTING W REFLEX): HIV: NONREACTIVE

## 2013-05-30 LAB — TSH: TSH: 1.401 u[IU]/mL (ref 0.350–4.500)

## 2013-05-31 LAB — CULTURE, OB URINE: Colony Count: 70000

## 2013-06-12 ENCOUNTER — Ambulatory Visit (INDEPENDENT_AMBULATORY_CARE_PROVIDER_SITE_OTHER): Payer: Medicaid Other | Admitting: Advanced Practice Midwife

## 2013-06-12 VITALS — BP 123/73 | Temp 97.5°F | Wt 153.0 lb

## 2013-06-12 DIAGNOSIS — O0933 Supervision of pregnancy with insufficient antenatal care, third trimester: Secondary | ICD-10-CM

## 2013-06-12 DIAGNOSIS — O0930 Supervision of pregnancy with insufficient antenatal care, unspecified trimester: Secondary | ICD-10-CM

## 2013-06-12 DIAGNOSIS — O368190 Decreased fetal movements, unspecified trimester, not applicable or unspecified: Secondary | ICD-10-CM

## 2013-06-12 LAB — POCT URINALYSIS DIP (DEVICE)
Bilirubin Urine: NEGATIVE
Ketones, ur: NEGATIVE mg/dL
Leukocytes, UA: NEGATIVE
Protein, ur: NEGATIVE mg/dL
Specific Gravity, Urine: 1.02 (ref 1.005–1.030)
pH: 6.5 (ref 5.0–8.0)

## 2013-06-12 NOTE — Progress Notes (Signed)
EFM applied for NST- pt reports decreased FM x1 week which she did not state to CNM or RN at time of initial assessment. Good FM during NST, pt was aware and states this is what she has been feeling. Pt advised that this is adequate amount of FM and that FHR is wnl- no decels.  Lisa Leftwich-Kirby notified.

## 2013-06-12 NOTE — Progress Notes (Signed)
Pulse- 96 Patient reports pain in her right groin

## 2013-06-12 NOTE — Progress Notes (Signed)
Doing well.  Good fetal movement, denies vaginal bleeding, LOF, regular contractions. FHR 145 but audible deceleration down to 90s for a few seconds on doppler.  NST in office today reactive.

## 2013-06-26 ENCOUNTER — Ambulatory Visit (INDEPENDENT_AMBULATORY_CARE_PROVIDER_SITE_OTHER): Payer: Medicaid Other | Admitting: Obstetrics & Gynecology

## 2013-06-26 VITALS — BP 109/77 | Temp 97.7°F | Wt 156.4 lb

## 2013-06-26 DIAGNOSIS — O0933 Supervision of pregnancy with insufficient antenatal care, third trimester: Secondary | ICD-10-CM

## 2013-06-26 DIAGNOSIS — Z3483 Encounter for supervision of other normal pregnancy, third trimester: Secondary | ICD-10-CM

## 2013-06-26 DIAGNOSIS — Z23 Encounter for immunization: Secondary | ICD-10-CM

## 2013-06-26 DIAGNOSIS — O0930 Supervision of pregnancy with insufficient antenatal care, unspecified trimester: Secondary | ICD-10-CM

## 2013-06-26 LAB — POCT URINALYSIS DIP (DEVICE)
Ketones, ur: NEGATIVE mg/dL
Protein, ur: NEGATIVE mg/dL
Specific Gravity, Urine: 1.02 (ref 1.005–1.030)

## 2013-06-26 MED ORDER — TETANUS-DIPHTH-ACELL PERTUSSIS 5-2.5-18.5 LF-MCG/0.5 IM SUSP: 0.5000 mL | Freq: Once | INTRAMUSCULAR | Status: DC

## 2013-06-26 NOTE — Patient Instructions (Signed)

## 2013-06-26 NOTE — Progress Notes (Signed)
Tdap today, no problems

## 2013-06-26 NOTE — Progress Notes (Signed)
P-96 

## 2013-06-27 ENCOUNTER — Other Ambulatory Visit: Payer: Self-pay | Admitting: Obstetrics & Gynecology

## 2013-06-27 ENCOUNTER — Ambulatory Visit (HOSPITAL_COMMUNITY)
Admission: RE | Admit: 2013-06-27 | Discharge: 2013-06-27 | Disposition: A | Discharge status: Home / Self Care | Payer: Medicaid Other | Source: Ambulatory Visit | Attending: Obstetrics & Gynecology | Admitting: Obstetrics & Gynecology

## 2013-06-27 ENCOUNTER — Encounter: Payer: Self-pay | Admitting: *Deleted

## 2013-06-27 DIAGNOSIS — Z3483 Encounter for supervision of other normal pregnancy, third trimester: Secondary | ICD-10-CM

## 2013-06-27 DIAGNOSIS — O0933 Supervision of pregnancy with insufficient antenatal care, third trimester: Secondary | ICD-10-CM

## 2013-06-27 DIAGNOSIS — O0930 Supervision of pregnancy with insufficient antenatal care, unspecified trimester: Secondary | ICD-10-CM | POA: Insufficient documentation

## 2013-06-27 DIAGNOSIS — D099 Carcinoma in situ, unspecified: Secondary | ICD-10-CM | POA: Insufficient documentation

## 2013-07-10 ENCOUNTER — Encounter: Payer: Self-pay | Admitting: *Deleted

## 2013-07-10 ENCOUNTER — Ambulatory Visit (INDEPENDENT_AMBULATORY_CARE_PROVIDER_SITE_OTHER): Payer: Medicaid Other | Admitting: Obstetrics and Gynecology

## 2013-07-10 VITALS — BP 118/81 | Temp 97.4°F | Wt 159.6 lb

## 2013-07-10 DIAGNOSIS — Z3483 Encounter for supervision of other normal pregnancy, third trimester: Secondary | ICD-10-CM

## 2013-07-10 DIAGNOSIS — N898 Other specified noninflammatory disorders of vagina: Secondary | ICD-10-CM

## 2013-07-10 DIAGNOSIS — O0933 Supervision of pregnancy with insufficient antenatal care, third trimester: Secondary | ICD-10-CM

## 2013-07-10 DIAGNOSIS — O9989 Other specified diseases and conditions complicating pregnancy, childbirth and the puerperium: Secondary | ICD-10-CM

## 2013-07-10 DIAGNOSIS — Z23 Encounter for immunization: Secondary | ICD-10-CM

## 2013-07-10 DIAGNOSIS — O0930 Supervision of pregnancy with insufficient antenatal care, unspecified trimester: Secondary | ICD-10-CM

## 2013-07-10 LAB — POCT URINALYSIS DIP (DEVICE)
Bilirubin Urine: NEGATIVE
Glucose, UA: NEGATIVE mg/dL
Hgb urine dipstick: NEGATIVE
Specific Gravity, Urine: 1.02 (ref 1.005–1.030)
Urobilinogen, UA: 0.2 mg/dL (ref 0.0–1.0)
pH: 6.5 (ref 5.0–8.0)

## 2013-07-10 NOTE — Progress Notes (Signed)
P=  96. C/o contractions that last for an hour or two last few days which is an increase. C/o vaginal discharge with odor

## 2013-07-10 NOTE — Addendum Note (Signed)
Addended by: Toula Moos on: 07/10/2013 12:03 PM   Modules accepted: Orders

## 2013-07-10 NOTE — Patient Instructions (Signed)

## 2013-07-10 NOTE — Addendum Note (Signed)
Addended by: Toula Moos on: 07/10/2013 02:01 PM   Modules accepted: Orders

## 2013-07-10 NOTE — Progress Notes (Signed)
Doing well. Occ mild UC and malodorous vaginal d/c> gray-white discharge, WP sent.  Reviewed anatomic scan with pt. 69th %ile, all nl.  Flu shot today

## 2013-07-11 LAB — WET PREP, GENITAL
Clue Cells Wet Prep HPF POC: NONE SEEN
Trich, Wet Prep: NONE SEEN

## 2013-07-24 ENCOUNTER — Encounter: Payer: Medicaid Other | Admitting: Obstetrics and Gynecology

## 2013-07-31 ENCOUNTER — Encounter: Payer: Self-pay | Admitting: Obstetrics and Gynecology

## 2013-07-31 ENCOUNTER — Ambulatory Visit (INDEPENDENT_AMBULATORY_CARE_PROVIDER_SITE_OTHER): Payer: Medicaid Other | Admitting: Obstetrics and Gynecology

## 2013-07-31 VITALS — BP 102/72 | Temp 97.6°F | Wt 164.3 lb

## 2013-07-31 DIAGNOSIS — Z3483 Encounter for supervision of other normal pregnancy, third trimester: Secondary | ICD-10-CM

## 2013-07-31 DIAGNOSIS — O0930 Supervision of pregnancy with insufficient antenatal care, unspecified trimester: Secondary | ICD-10-CM

## 2013-07-31 LAB — POCT URINALYSIS DIP (DEVICE)
Bilirubin Urine: NEGATIVE
Glucose, UA: NEGATIVE mg/dL
Ketones, ur: NEGATIVE mg/dL
Leukocytes, UA: NEGATIVE
Nitrite: NEGATIVE

## 2013-07-31 LAB — OB RESULTS CONSOLE GBS: GBS: NEGATIVE

## 2013-07-31 NOTE — Patient Instructions (Signed)
Contraception Choices  Contraception (birth control) is the use of any methods or devices to prevent pregnancy. Below are some methods to help avoid pregnancy.  HORMONAL METHODS   · Contraceptive implant. This is a thin, plastic tube containing progesterone hormone. It does not contain estrogen hormone. Your caregiver inserts the tube in the inner part of the upper arm. The tube can remain in place for up to 3 years. After 3 years, the implant must be removed. The implant prevents the ovaries from releasing an egg (ovulation), thickens the cervical mucus which prevents sperm from entering the uterus, and thins the lining of the inside of the uterus.  · Progesterone-only injections. These injections are given every 3 months by your caregiver to prevent pregnancy. This synthetic progesterone hormone stops the ovaries from releasing eggs. It also thickens cervical mucus and changes the uterine lining. This makes it harder for sperm to survive in the uterus.  · Birth control pills. These pills contain estrogen and progesterone hormone. They work by stopping the egg from forming in the ovary (ovulation). Birth control pills are prescribed by a caregiver. Birth control pills can also be used to treat heavy periods.  · Minipill. This type of birth control pill contains only the progesterone hormone. They are taken every day of each month and must be prescribed by your caregiver.  · Birth control patch. The patch contains hormones similar to those in birth control pills. It must be changed once a week and is prescribed by a caregiver.  · Vaginal ring. The ring contains hormones similar to those in birth control pills. It is left in the vagina for 3 weeks, removed for 1 week, and then a new one is put back in place. The patient must be comfortable inserting and removing the ring from the vagina. A caregiver's prescription is necessary.  · Emergency contraception. Emergency contraceptives prevent pregnancy after unprotected  sexual intercourse. This pill can be taken right after sex or up to 5 days after unprotected sex. It is most effective the sooner you take the pills after having sexual intercourse. Emergency contraceptive pills are available without a prescription. Check with your pharmacist. Do not use emergency contraception as your only form of birth control.  BARRIER METHODS   · Female condom. This is a thin sheath (latex or rubber) that is worn over the penis during sexual intercourse. It can be used with spermicide to increase effectiveness.  · Female condom. This is a soft, loose-fitting sheath that is put into the vagina before sexual intercourse.  · Diaphragm. This is a soft, latex, dome-shaped barrier that must be fitted by a caregiver. It is inserted into the vagina, along with a spermicidal jelly. It is inserted before intercourse. The diaphragm should be left in the vagina for 6 to 8 hours after intercourse.  · Cervical cap. This is a round, soft, latex or plastic cup that fits over the cervix and must be fitted by a caregiver. The cap can be left in place for up to 48 hours after intercourse.  · Sponge. This is a soft, circular piece of polyurethane foam. The sponge has spermicide in it. It is inserted into the vagina after wetting it and before sexual intercourse.  · Spermicides. These are chemicals that kill or block sperm from entering the cervix and uterus. They come in the form of creams, jellies, suppositories, foam, or tablets. They do not require a prescription. They are inserted into the vagina with an applicator before having sexual intercourse.   The process must be repeated every time you have sexual intercourse.  INTRAUTERINE CONTRACEPTION  · Intrauterine device (IUD). This is a T-shaped device that is put in a woman's uterus during a menstrual period to prevent pregnancy. There are 2 types:  · Copper IUD. This type of IUD is wrapped in copper wire and is placed inside the uterus. Copper makes the uterus and  fallopian tubes produce a fluid that kills sperm. It can stay in place for 10 years.  · Hormone IUD. This type of IUD contains the hormone progestin (synthetic progesterone). The hormone thickens the cervical mucus and prevents sperm from entering the uterus, and it also thins the uterine lining to prevent implantation of a fertilized egg. The hormone can weaken or kill the sperm that get into the uterus. It can stay in place for 5 years.  PERMANENT METHODS OF CONTRACEPTION  · Female tubal ligation. This is when the woman's fallopian tubes are surgically sealed, tied, or blocked to prevent the egg from traveling to the uterus.  · Female sterilization. This is when the female has the tubes that carry sperm tied off (vasectomy). This blocks sperm from entering the vagina during sexual intercourse. After the procedure, the man can still ejaculate fluid (semen).  NATURAL PLANNING METHODS  · Natural family planning. This is not having sexual intercourse or using a barrier method (condom, diaphragm, cervical cap) on days the woman could become pregnant.  · Calendar method. This is keeping track of the length of each menstrual cycle and identifying when you are fertile.  · Ovulation method. This is avoiding sexual intercourse during ovulation.  · Symptothermal method. This is avoiding sexual intercourse during ovulation, using a thermometer and ovulation symptoms.  · Post-ovulation method. This is timing sexual intercourse after you have ovulated.  Regardless of which type or method of contraception you choose, it is important that you use condoms to protect against the transmission of sexually transmitted diseases (STDs). Talk with your caregiver about which form of contraception is most appropriate for you.  Document Released: 09/19/2005 Document Revised: 12/12/2011 Document Reviewed: 01/26/2011  Exit[employer]® Patient Information ©2014 [employer].

## 2013-07-31 NOTE — Progress Notes (Signed)
P= 99,c/o hands/fingers hurting in the am.

## 2013-07-31 NOTE — Progress Notes (Signed)
Doing well. Plans [place]ron[place] probably. Cultures done.

## 2013-07-31 NOTE — Addendum Note (Signed)
Addended by: Faythe Casa on: 07/31/2013 12:13 PM   Modules accepted: Orders

## 2013-08-01 LAB — GC/CHLAMYDIA PROBE AMP: GC Probe RNA: NEGATIVE

## 2013-08-03 LAB — CULTURE, BETA STREP (GROUP B ONLY)

## 2013-08-04 ENCOUNTER — Encounter: Payer: Self-pay | Admitting: Family

## 2013-08-07 ENCOUNTER — Ambulatory Visit (INDEPENDENT_AMBULATORY_CARE_PROVIDER_SITE_OTHER): Payer: Medicaid Other | Admitting: Advanced Practice Midwife

## 2013-08-07 VITALS — BP 119/78 | Temp 97.9°F

## 2013-08-07 DIAGNOSIS — O0930 Supervision of pregnancy with insufficient antenatal care, unspecified trimester: Secondary | ICD-10-CM

## 2013-08-07 DIAGNOSIS — Z3483 Encounter for supervision of other normal pregnancy, third trimester: Secondary | ICD-10-CM

## 2013-08-07 LAB — POCT URINALYSIS DIP (DEVICE)
Nitrite: NEGATIVE
Protein, ur: NEGATIVE mg/dL
Urobilinogen, UA: 0.2 mg/dL (ref 0.0–1.0)

## 2013-08-07 NOTE — Progress Notes (Signed)
P=101  Pt c/o of lower abd pain/contractions, desires cervical exam today.

## 2013-08-07 NOTE — Addendum Note (Signed)
Addended by: Gerome Apley on: 08/07/2013 11:46 AM   Modules accepted: Orders

## 2013-08-07 NOTE — Progress Notes (Signed)
Doing well.  Good fetal movement, denies vaginal bleeding, LOF, regular contractions.  Reports some constant pelvic pressure and irregular cramping.

## 2013-08-14 ENCOUNTER — Ambulatory Visit (INDEPENDENT_AMBULATORY_CARE_PROVIDER_SITE_OTHER): Payer: Medicaid Other | Admitting: Obstetrics and Gynecology

## 2013-08-14 ENCOUNTER — Encounter: Payer: Self-pay | Admitting: Obstetrics and Gynecology

## 2013-08-14 VITALS — BP 119/80 | Temp 97.0°F | Wt 167.6 lb

## 2013-08-14 DIAGNOSIS — O0930 Supervision of pregnancy with insufficient antenatal care, unspecified trimester: Secondary | ICD-10-CM

## 2013-08-14 DIAGNOSIS — Z3483 Encounter for supervision of other normal pregnancy, third trimester: Secondary | ICD-10-CM

## 2013-08-14 LAB — POCT URINALYSIS DIP (DEVICE)
Hgb urine dipstick: NEGATIVE
Specific Gravity, Urine: 1.02 (ref 1.005–1.030)
pH: 6.5 (ref 5.0–8.0)

## 2013-08-14 NOTE — Progress Notes (Signed)
Has had UR congestion, postnal drip, sore throat, rt. ear pain. No SOB, fever. H/A resolved.  Oropharynx injected, no purulence. Left TM normal, Rt. TM grey opaque with no LR. Advised Sudafed for serous otitis, Tylenol, rest.  RLP and labor s/sx discussed. Wants cx check next visit.

## 2013-08-14 NOTE — Progress Notes (Signed)
Pulse- 110   Pain/pressure-pelvic, lower back   Edema-hands/feet Pt reports having sore throat and ears soar with a cough and headache

## 2013-08-14 NOTE — Patient Instructions (Addendum)
Vaginal Delivery During delivery, your health care provider will help you give birth to your baby. During a vaginal delivery, you will work to push the baby out of your vagina. However, before you can push your baby out, a few things need to happen. The opening of your uterus (cervix) has to soften, thin out, and open up (dilate) all the way to 10 cm. Also, your baby has to move down from the uterus into your vagina.  SIGNS OF LABOR  Your health care provider will first need to make sure you are in labor. Signs of labor include:   Passing what is called the mucous plug before labor begins. This is a small amount of blood-stained mucus.   Having regular, painful uterine contractions.   The time between contractions gets shorter.   The discomfort and pain gradually get more intense.  Contraction pains get worse when walking and do not go away when resting.   Your cervix becomes thinner (effacement) and dilates. BEFORE THE DELIVERY Once you are in labor and admitted into the hospital or care center, your health care provider may do the following:   Perform a complete physical exam.  Review any complications related to pregnancy or labor.  Check your blood pressure, pulse, temperature, and heart rate (vital signs).   Determine if, and when, the rupture of amniotic membranes occurred.  Do a vaginal exam (using a sterile glove and lubricant) to determine:   The position (presentation) of the baby. Is the baby's head presenting first (vertex) in the birth canal (vagina), or are the feet or buttocks first (breech)?   The level (station) of the baby's head within the birth canal.   The effacement and dilatation of the cervix.   An electronic fetal monitor is usually placed on your abdomen when you first arrive. This is used to monitor your contractions and the baby's heart rate.  When the monitor is on your abdomen (external fetal monitor), it can only pick up the frequency and  length of your contractions. It cannot tell the strength of your contractions.  If it becomes necessary for your health care provider to know exactly how strong your contractions are or to see exactly what the baby's heart rate is doing, an internal monitor may be inserted into your vagina and uterus. Your health care provider will discuss the benefits and risks of using an internal monitor and obtain your permission before inserting the device.  Continuous fetal monitoring may be needed if you have an epidural, are receiving certain medicines (such as oxytocin), or have pregnancy or labor complications.  An IV access tube may be placed into a vein in your arm to deliver fluids and medicines if necessary. THREE STAGES OF LABOR AND DELIVERY Normal labor and delivery is divided into three stages. First Stage This stage starts when you begin to contract regularly and your cervix begins to efface and dilate. It ends when your cervix is completely open (fully dilated). The first stage is the longest stage of labor and can last from 3 hours to 15 hours.  Several methods are available to help with labor pain. You and your health care provider will decide which option is best for you. Options include:   Opioid medicines. These are strong pain medicines that you can get through your IV tube or as a shot into your muscle. These medicines lessen pain but do not make it go away completely.  Epidural. A medicine is given through a thin tube   that is inserted in your back. The medicine numbs the lower part of your body and prevents any pain in that area.  Paracervical pain medicine. This is an injection of an anesthetic on each side of your cervix.   You may request natural childbirth, which does not involve the use of pain medicines or an epidural during labor and delivery. Instead, you will use other things, such as breathing exercises, to help cope with the pain. Second Stage The second stage of labor  begins when your cervix is fully dilated at 10 cm. It continues until you push your baby down through the birth canal and the baby is born. This stage can take only minutes or several hours.  The location of your baby's head as it moves through the birth canal is reported as a number called a station. If the baby's head has not started its descent, the station is described as being at minus 3 ( 3). When your baby's head is at the zero station, it is at the middle of the birth canal and is engaged in the pelvis. The station of your baby helps indicate the progress of the second stage of labor.  When your baby is born, your health care provider may hold the baby with his or her head lowered to prevent amniotic fluid, mucus, and blood from getting into the baby's lungs. The baby's mouth and nose may be suctioned with a small bulb syringe to remove any additional fluid.  Your health care provider may then place the baby on your stomach. It is important to keep the baby from getting cold. To do this, the health care provider will dry the baby off, place the baby directly on your skin (with no blankets between you and the baby), and cover the baby with warm, dry blankets.   The umbilical cord is cut. Third Stage During the third stage of labor, your health care provider will deliver the placenta (afterbirth) and make sure your bleeding is under control. The delivery of the placenta usually takes about 5 minutes but can take up to 30 minutes. After the placenta is delivered, a medicine may be given either by IV or injection to help contract the uterus and control bleeding. If you are planning to breastfeed, you can try to do so now. After you deliver the placenta, your uterus should contract and get very firm. If your uterus does not remain firm, your health care provider will massage it. This is important because the contraction of the uterus helps cut off bleeding at the site where the placenta was attached  to your uterus. If your uterus does not contract properly and stay firm, you may continue to bleed heavily. If there is a lot of bleeding, medicines may be given to contract the uterus and stop the bleeding.  Document Released: 06/28/2008 Document Revised: 05/22/2013 Document Reviewed: 03/10/2013 [facility] Patient Information 2014 [place], [place]. Serous Otitis Media  Serous otitis media is fluid in the middle ear space. This space contains the bones for hearing and air. Air in the middle ear space helps to transmit sound.  The air gets there through the eustachian tube. This tube goes from the back of the nose (nasopharynx) to the middle ear space. It keeps the pressure in the middle ear the same as the outside world. It also helps to drain fluid from the middle ear space. CAUSES  Serous otitis media occurs when the eustachian tube gets blocked. Blockage can come from:  Ear  infections.  Colds and other upper respiratory infections.  Allergies.  Irritants such as cigarette smoke.  Sudden changes in air pressure (such as descending in an airplane).  Enlarged adenoids.  A mass in the nasopharynx. During colds and upper respiratory infections, the middle ear space can become temporarily filled with fluid. This can happen after an ear infection also. Once the infection clears, the fluid will generally drain out of the ear through the eustachian tube. If it does not, then serous otitis media occurs. SIGNS AND SYMPTOMS   Hearing loss.  A feeling of fullness in the ear, without pain.  Young children may not show any symptoms but may show slight behavioral changes, such as agitation, ear pulling, or crying. DIAGNOSIS  Serous otitis media is diagnosed by an ear exam. Tests may be done to check on the movement of the eardrum. Hearing exams may also be done. TREATMENT  The fluid most often goes away without treatment. If allergy is the cause, allergy treatment may be helpful. Fluid that persists  for several months may require minor surgery. A small tube is placed in the eardrum to:  Drain the fluid.  Restore the air in the middle ear space. In certain situations, antibiotics are used to avoid surgery. Surgery may be done to remove enlarged adenoids (if this is the cause). HOME CARE INSTRUCTIONS   Keep children away from tobacco smoke.  Be sure to keep any follow-up appointments. SEEK MEDICAL CARE IF:   Your hearing is not better in 3 months.  Your hearing is worse.  You have ear pain.  You have drainage from the ear.  You have dizziness.  You have serous otitis media only in one ear or have any bleeding from your nose (epistaxis).  You notice a lump on your neck. MAKE SURE YOU:  Understand these instructions.   Will watch your condition.   Will get help right away if you are not doing well or get worse.  Document Released: 12/10/2003 Document Revised: 05/22/2013 Document Reviewed: 04/16/2013 [facility] Patient Information 2014 [place], [place].

## 2013-08-21 ENCOUNTER — Ambulatory Visit (INDEPENDENT_AMBULATORY_CARE_PROVIDER_SITE_OTHER): Payer: Medicaid Other | Admitting: Obstetrics and Gynecology

## 2013-08-21 VITALS — BP 117/80 | Temp 97.8°F | Wt 170.0 lb

## 2013-08-21 DIAGNOSIS — O0930 Supervision of pregnancy with insufficient antenatal care, unspecified trimester: Secondary | ICD-10-CM

## 2013-08-21 DIAGNOSIS — O0933 Supervision of pregnancy with insufficient antenatal care, third trimester: Secondary | ICD-10-CM

## 2013-08-21 LAB — POCT URINALYSIS DIP (DEVICE)
Glucose, UA: NEGATIVE mg/dL
Protein, ur: NEGATIVE mg/dL
Specific Gravity, Urine: 1.02 (ref 1.005–1.030)
Urobilinogen, UA: 0.2 mg/dL (ref 0.0–1.0)
pH: 7 (ref 5.0–8.0)

## 2013-08-21 NOTE — Progress Notes (Signed)
P= 90 C/o of intermittent Lower abdominal/pelvic pressure and contractions.

## 2013-08-21 NOTE — Progress Notes (Signed)
CTS sx and relief measures. Postdates testing next week.

## 2013-08-23 ENCOUNTER — Ambulatory Visit (INDEPENDENT_AMBULATORY_CARE_PROVIDER_SITE_OTHER): Payer: Medicaid Other | Admitting: *Deleted

## 2013-08-23 VITALS — BP 118/77

## 2013-08-23 DIAGNOSIS — O480 Post-term pregnancy: Secondary | ICD-10-CM

## 2013-08-23 NOTE — Progress Notes (Signed)
NST reviewed and reactive.  Gussie Murton L. Harraway-Smith, M.D., FACOG    

## 2013-08-23 NOTE — Progress Notes (Signed)
P = 96   Pt scheduled for BPP on 11/26 @ 1600 due to postdates.  IOL scheduled on 11/28 @ 0730

## 2013-08-26 ENCOUNTER — Encounter (HOSPITAL_COMMUNITY): Payer: Self-pay | Admitting: *Deleted

## 2013-08-26 ENCOUNTER — Telehealth (HOSPITAL_COMMUNITY): Payer: Self-pay | Admitting: *Deleted

## 2013-08-26 NOTE — Telephone Encounter (Signed)
Preadmission screen  

## 2013-08-28 ENCOUNTER — Ambulatory Visit (HOSPITAL_COMMUNITY)
Admission: RE | Admit: 2013-08-28 | Discharge: 2013-08-28 | Disposition: A | Discharge status: Home / Self Care | Payer: Medicaid Other | Source: Ambulatory Visit | Attending: Obstetrics & Gynecology | Admitting: Obstetrics & Gynecology

## 2013-08-28 ENCOUNTER — Other Ambulatory Visit: Payer: Self-pay | Admitting: Obstetrics & Gynecology

## 2013-08-28 DIAGNOSIS — O480 Post-term pregnancy: Secondary | ICD-10-CM

## 2013-08-29 ENCOUNTER — Inpatient Hospital Stay (HOSPITAL_COMMUNITY): Payer: Medicaid Other | Admitting: Anesthesiology

## 2013-08-29 ENCOUNTER — Encounter (HOSPITAL_COMMUNITY): Payer: Medicaid Other | Admitting: Anesthesiology

## 2013-08-29 ENCOUNTER — Encounter (HOSPITAL_COMMUNITY): Payer: Self-pay | Admitting: *Deleted

## 2013-08-29 ENCOUNTER — Inpatient Hospital Stay (HOSPITAL_COMMUNITY)
Admission: AD | Admit: 2013-08-29 | Discharge: 2013-08-31 | DRG: 775 | Disposition: A | Discharge status: Home / Self Care | Payer: Medicaid Other | Source: Ambulatory Visit | Attending: Obstetrics & Gynecology | Admitting: Obstetrics & Gynecology

## 2013-08-29 DIAGNOSIS — IMO0001 Reserved for inherently not codable concepts without codable children: Secondary | ICD-10-CM

## 2013-08-29 DIAGNOSIS — Z3483 Encounter for supervision of other normal pregnancy, third trimester: Secondary | ICD-10-CM

## 2013-08-29 LAB — CBC
HCT: 39.7 % (ref 36.0–46.0)
Hemoglobin: 13.7 g/dL (ref 12.0–15.0)
MCH: 31.3 pg (ref 26.0–34.0)
MCHC: 34.5 g/dL (ref 30.0–36.0)
MCV: 90.6 fL (ref 78.0–100.0)
Platelets: 243 10*3/uL (ref 150–400)
RDW: 13.2 % (ref 11.5–15.5)

## 2013-08-29 MED ORDER — DIPHENHYDRAMINE HCL 25 MG PO CAPS: 25.0000 mg | ORAL_CAPSULE | Freq: Four times a day (QID) | ORAL | Status: DC | PRN

## 2013-08-29 MED ORDER — TETANUS-DIPHTH-ACELL PERTUSSIS 5-2.5-18.5 LF-MCG/0.5 IM SUSP
0.5000 mL | Freq: Once | INTRAMUSCULAR | Status: DC
  Filled 2013-08-29: qty 0.5

## 2013-08-29 MED ORDER — LIDOCAINE HCL (PF) 1 % IJ SOLN: 30.0000 mL | INTRAMUSCULAR | Status: DC | PRN

## 2013-08-29 MED ORDER — EPHEDRINE 5 MG/ML INJ: 10.0000 mg | INTRAVENOUS | Status: DC | PRN

## 2013-08-29 MED ORDER — LIDOCAINE HCL (PF) 1 % IJ SOLN
INTRAMUSCULAR | Status: DC | PRN
  Administered 2013-08-29 (×2): 5 mL

## 2013-08-29 MED ORDER — METHYLERGONOVINE MALEATE 0.2 MG PO TABS: 0.2000 mg | ORAL_TABLET | ORAL | Status: DC | PRN

## 2013-08-29 MED ORDER — OXYTOCIN 40 UNITS IN LACTATED RINGERS INFUSION - SIMPLE MED: 62.5000 mL/h | INTRAVENOUS | Status: DC | PRN

## 2013-08-29 MED ORDER — IBUPROFEN 600 MG PO TABS
600.0000 mg | ORAL_TABLET | Freq: Four times a day (QID) | ORAL | Status: DC
  Administered 2013-08-29 – 2013-08-31 (×6): 600 mg via ORAL
  Filled 2013-08-29 (×6): qty 1

## 2013-08-29 MED ORDER — ONDANSETRON HCL 4 MG/2ML IJ SOLN: 4.0000 mg | Freq: Four times a day (QID) | INTRAMUSCULAR | Status: DC | PRN

## 2013-08-29 MED ORDER — SIMETHICONE 80 MG PO CHEW: 80.0000 mg | CHEWABLE_TABLET | ORAL | Status: DC | PRN

## 2013-08-29 MED ORDER — FENTANYL 2.5 MCG/ML BUPIVACAINE 1/10 % EPIDURAL INFUSION (WH - ANES)
INTRAMUSCULAR | Status: AC
  Filled 2013-08-29: qty 125

## 2013-08-29 MED ORDER — IBUPROFEN 600 MG PO TABS: 600.0000 mg | ORAL_TABLET | Freq: Four times a day (QID) | ORAL | Status: DC | PRN

## 2013-08-29 MED ORDER — BENZOCAINE-MENTHOL 20-0.5 % EX AERO
1.0000 "application " | INHALATION_SPRAY | CUTANEOUS | Status: DC | PRN
  Administered 2013-08-29: 1 via TOPICAL
  Filled 2013-08-29 (×2): qty 56

## 2013-08-29 MED ORDER — DIBUCAINE 1 % RE OINT
1.0000 "application " | TOPICAL_OINTMENT | RECTAL | Status: DC | PRN
  Filled 2013-08-29: qty 28

## 2013-08-29 MED ORDER — BISACODYL 10 MG RE SUPP
10.0000 mg | Freq: Every day | RECTAL | Status: DC | PRN
  Filled 2013-08-29: qty 1

## 2013-08-29 MED ORDER — FLEET ENEMA 7-19 GM/118ML RE ENEM: 1.0000 | ENEMA | RECTAL | Status: DC | PRN

## 2013-08-29 MED ORDER — OXYTOCIN BOLUS FROM INFUSION: 500.0000 mL | INTRAVENOUS | Status: DC

## 2013-08-29 MED ORDER — LACTATED RINGERS IV SOLN
500.0000 mL | INTRAVENOUS | Status: DC | PRN
  Administered 2013-08-29: 1000 mL via INTRAVENOUS

## 2013-08-29 MED ORDER — FENTANYL 2.5 MCG/ML BUPIVACAINE 1/10 % EPIDURAL INFUSION (WH - ANES): 14.0000 mL/h | INTRAMUSCULAR | Status: DC | PRN

## 2013-08-29 MED ORDER — FENTANYL CITRATE 0.05 MG/ML IJ SOLN: 50.0000 ug | INTRAMUSCULAR | Status: DC | PRN

## 2013-08-29 MED ORDER — ZOLPIDEM TARTRATE 5 MG PO TABS: 5.0000 mg | ORAL_TABLET | Freq: Every evening | ORAL | Status: DC | PRN

## 2013-08-29 MED ORDER — OXYCODONE-ACETAMINOPHEN 5-325 MG PO TABS: 1.0000 | ORAL_TABLET | ORAL | Status: DC | PRN

## 2013-08-29 MED ORDER — FENTANYL 2.5 MCG/ML BUPIVACAINE 1/10 % EPIDURAL INFUSION (WH - ANES)
INTRAMUSCULAR | Status: DC | PRN
  Administered 2013-08-29: 14 mL/h via EPIDURAL

## 2013-08-29 MED ORDER — EPHEDRINE 5 MG/ML INJ
INTRAVENOUS | Status: AC
  Filled 2013-08-29: qty 4

## 2013-08-29 MED ORDER — LACTATED RINGERS IV SOLN: INTRAVENOUS | Status: DC

## 2013-08-29 MED ORDER — FLEET ENEMA 7-19 GM/118ML RE ENEM: 1.0000 | ENEMA | Freq: Every day | RECTAL | Status: DC | PRN

## 2013-08-29 MED ORDER — ONDANSETRON HCL 4 MG PO TABS: 4.0000 mg | ORAL_TABLET | ORAL | Status: DC | PRN

## 2013-08-29 MED ORDER — SENNOSIDES-DOCUSATE SODIUM 8.6-50 MG PO TABS
2.0000 | ORAL_TABLET | ORAL | Status: DC
  Administered 2013-08-29 – 2013-08-30 (×2): 2 via ORAL
  Filled 2013-08-29 (×2): qty 2

## 2013-08-29 MED ORDER — OXYTOCIN 40 UNITS IN LACTATED RINGERS INFUSION - SIMPLE MED
1.0000 m[IU]/min | INTRAVENOUS | Status: DC
  Administered 2013-08-29: 2 m[IU]/min via INTRAVENOUS

## 2013-08-29 MED ORDER — LACTATED RINGERS IV SOLN: 500.0000 mL | Freq: Once | INTRAVENOUS | Status: DC

## 2013-08-29 MED ORDER — TERBUTALINE SULFATE 1 MG/ML IJ SOLN: 0.2500 mg | Freq: Once | INTRAMUSCULAR | Status: DC | PRN

## 2013-08-29 MED ORDER — CITRIC ACID-SODIUM CITRATE 334-500 MG/5ML PO SOLN: 30.0000 mL | ORAL | Status: DC | PRN

## 2013-08-29 MED ORDER — METHYLERGONOVINE MALEATE 0.2 MG/ML IJ SOLN: 0.2000 mg | INTRAMUSCULAR | Status: DC | PRN

## 2013-08-29 MED ORDER — LANOLIN HYDROUS EX OINT: TOPICAL_OINTMENT | CUTANEOUS | Status: DC | PRN

## 2013-08-29 MED ORDER — ONDANSETRON HCL 4 MG/2ML IJ SOLN: 4.0000 mg | INTRAMUSCULAR | Status: DC | PRN

## 2013-08-29 MED ORDER — ACETAMINOPHEN 325 MG PO TABS: 650.0000 mg | ORAL_TABLET | ORAL | Status: DC | PRN

## 2013-08-29 MED ORDER — FERROUS SULFATE 325 (65 FE) MG PO TABS
325.0000 mg | ORAL_TABLET | Freq: Two times a day (BID) | ORAL | Status: DC
  Administered 2013-08-30 – 2013-08-31 (×3): 325 mg via ORAL
  Filled 2013-08-29 (×4): qty 1

## 2013-08-29 MED ORDER — PHENYLEPHRINE 40 MCG/ML (10ML) SYRINGE FOR IV PUSH (FOR BLOOD PRESSURE SUPPORT): 80.0000 ug | PREFILLED_SYRINGE | INTRAVENOUS | Status: DC | PRN

## 2013-08-29 MED ORDER — PHENYLEPHRINE 40 MCG/ML (10ML) SYRINGE FOR IV PUSH (FOR BLOOD PRESSURE SUPPORT)
PREFILLED_SYRINGE | INTRAVENOUS | Status: AC
  Filled 2013-08-29: qty 5

## 2013-08-29 MED ORDER — MEASLES, MUMPS & RUBELLA VAC ~~LOC~~ INJ
0.5000 mL | INJECTION | Freq: Once | SUBCUTANEOUS | Status: DC
  Filled 2013-08-29: qty 0.5

## 2013-08-29 MED ORDER — OXYTOCIN 40 UNITS IN LACTATED RINGERS INFUSION - SIMPLE MED
62.5000 mL/h | INTRAVENOUS | Status: DC
  Administered 2013-08-29: 999 mL/h via INTRAVENOUS
  Filled 2013-08-29: qty 1000

## 2013-08-29 MED ORDER — DIPHENHYDRAMINE HCL 50 MG/ML IJ SOLN: 12.5000 mg | INTRAMUSCULAR | Status: DC | PRN

## 2013-08-29 MED ORDER — PRENATAL MULTIVITAMIN CH
1.0000 | ORAL_TABLET | Freq: Every day | ORAL | Status: DC
  Administered 2013-08-30: 1 via ORAL
  Filled 2013-08-29: qty 1

## 2013-08-29 MED ORDER — WITCH HAZEL-GLYCERIN EX PADS: 1.0000 "application " | MEDICATED_PAD | CUTANEOUS | Status: DC | PRN

## 2013-08-29 NOTE — MAU Note (Signed)
Patient states she is having contractions every 5 minutes with bloody show. Denies leaking. States baby was very quiet last night and has only felt 3 movements today.

## 2013-08-29 NOTE — Progress Notes (Signed)
   Kathleen Ward is a 32 y.o. [id number] at [redacted]w[redacted]d  admitted for active labor  Subjective: contractions have slowed down despite frequent ambulation  Objective: BP 129/75  Pulse 88  Temp(Src) 97.9 F (36.6 C) (Oral)  Resp 18  Ht 5\' 1"  (1.549 m)  Wt 77.293 kg (170 lb 6.4 oz)  BMI 32.21 kg/m2  LMP 10/29/2012    FHT:  FHR: 140 bpm, variability: moderate,  accelerations:  Present,  decelerations:  Absent UC:   irregular, every 3-5 minutes SVE: 3-4/80/-2     Labs: Lab Results  Component Value Date   WBC 8.6 08/29/2013   HGB 13.7 08/29/2013   HCT 39.7 08/29/2013   MCV 90.6 08/29/2013   PLT 243 08/29/2013    Assessment / Plan: Protracted latent phase Will augment with pitocin Labor: early Fetal Wellbeing:  Category I Pain Control:  Labor support without medications Anticipated MOD:  NSVD  CRESENZO-DISHMAN,[place] 08/29/2013, 1:21 PM

## 2013-08-29 NOTE — Anesthesia Procedure Notes (Signed)
Epidural Patient location during procedure: OB Start time: 08/29/2013 2:43 PM  Staffing Anesthesiologist: Brayton Caves Performed by: anesthesiologist   Preanesthetic Checklist Completed: patient identified, site marked, surgical consent, pre-op evaluation, timeout performed, IV checked, risks and benefits discussed and monitors and equipment checked  Epidural Patient position: sitting Prep: site prepped and draped and DuraPrep Patient monitoring: continuous pulse ox and blood pressure Approach: midline Injection technique: LOR air  Needle:  Needle type: Tuohy  Needle gauge: 17 G Needle length: 9 cm and 9 Needle insertion depth: 5 cm cm Catheter type: closed end flexible Catheter size: 19 Gauge Catheter at skin depth: 10 cm Test dose: negative  Assessment Events: blood not aspirated, injection not painful, no injection resistance, negative IV test and no paresthesia  Additional Notes Patient identified.  Risk benefits discussed including failed block, incomplete pain control, headache, nerve damage, paralysis, blood pressure changes, nausea, vomiting, reactions to medication both toxic or allergic, and postpartum back pain.  Patient expressed understanding and wished to proceed.  All questions were answered.  Sterile technique used throughout procedure and epidural site dressed with sterile barrier dressing. No paresthesia or other complications noted.The patient did not experience any signs of intravascular injection such as tinnitus or metallic taste in mouth nor signs of intrathecal spread such as rapid motor block. Please see nursing notes for vital signs.

## 2013-08-29 NOTE — H&P (Signed)
Kathleen Ward is a 32 y.o. female [phone] with IUP at [redacted]w[redacted]d presenting for contractions since last night, now q 3 minutes, stronger. Contractions are associated with scant staining vaginal bleeding.  Membranes are intact, with active fetal movement.   [facility] at [facility] 28 wks  Prenatal History/Complications: Late to care Past Medical History: Past Medical History  Diagnosis Date  . Thyroid disease   . Anxiety   . Hx of preeclampsia, prior pregnancy, currently pregnant 2004  . PROM (premature rupture of membranes) 2004  . Abnormal Pap smear     Past Surgical History: Past Surgical History  Procedure Laterality Date  . Wisdom tooth extraction    . Abortion      3 previous abortions    Obstetrical History: OB History   Grav Para Term Preterm Abortions TAB SAB Ect Mult Living   7 2 2  4 4    2        Social History: History   Social History  . Marital Status: Single    Spouse Name: N/A    Number of Children: N/A  . Years of Education: N/A   Social History Main Topics  . Smoking status: Never Smoker   . Smokeless tobacco: Never Used  . Alcohol Use: No  . Drug Use: No  . Sexual Activity: Not Currently    Birth Control/ Protection: Condom   Other Topics Concern  . None   Social History Narrative  . None    Family History: Family History  Problem Relation Age of Onset  . Hyperlipidemia Father   . Liver disease Mother   . Thyroid disease Mother   . Fibroids Mother   . Diabetes Paternal Grandmother   . Cancer Paternal Grandmother   . Alzheimer's disease Paternal Grandmother   . Diabetes Paternal Grandfather   . Alzheimer's disease Paternal Grandfather     Allergies: No Known Allergies  Facility-administered medications prior to admission  Medication Dose Route Frequency Provider Last Rate Last Dose  . TDaP (BOOSTRIX) injection 0.5 mL  0.5 mL Intramuscular Once Adam Phenix, MD       Prescriptions prior to admission  Medication Sig Dispense  Refill  . Prenatal Vit-Fe Fumarate-FA (PRENATAL MULTIVITAMIN) TABS tablet Take 1 tablet by mouth daily at 12 noon.         Review of Systems   Constitutional: Negative for fever, chills, weight loss, malaise/fatigue and diaphoresis.  HENT: Negative for hearing loss, ear pain, nosebleeds, congestion, sore throat, neck pain, tinnitus and ear discharge.   Eyes: Negative for blurred vision, double vision, photophobia, pain, discharge and redness.  Respiratory: Negative for cough, hemoptysis, sputum production, shortness of breath, wheezing and stridor.   Cardiovascular: Negative for chest pain, palpitations, orthopnea,  leg swelling  Gastrointestinal: Positive for abdominal pain. Negative for heartburn, nausea, vomiting, diarrhea, constipation, blood in stool Genitourinary: Negative for dysuria, urgency, frequency, hematuria and flank pain.  Musculoskeletal: Negative for myalgias, back pain, joint pain and falls.  Skin: Negative for itching and rash.  Neurological: Negative for dizziness, tingling, tremors, sensory change, speech change, focal weakness, seizures, loss of consciousness, weakness and headaches.  Endo/Heme/Allergies: Negative for environmental allergies and polydipsia. Does not bruise/bleed easily.  Psychiatric/Behavioral: Negative for depression, suicidal ideas, hallucinations, memory loss and substance abuse. The patient is not nervous/anxious and does not have insomnia.       Blood pressure 126/86, pulse 88, temperature 97.7 F (36.5 C), temperature source Oral, resp. rate 20, height 5\' 1"  (1.549 m), weight  170 lb 6.4 oz (77.293 kg), last menstrual period 10/29/2012. General appearance: alert, cooperative and no distress Lungs: clear to auscultation bilaterally Heart: regular rate and rhythm Abdomen: soft, non-tender; bowel sounds normal  Extremities: Homans sign is negative, no sign of DVT DTR's 2+ Presentation: cephalic Fetal monitoringBaseline: 140 bpm, Variability:  Good {> 6 bpm), Accelerations: Reactive and Decelerations: Early Uterine activity  Mod ctx q 2-3 minutes  Dilation: 3.5 Effacement (%): 70 Station: -2 Exam by:: Dorrene German RN   Prenatal labs: ABO, Rh: O/POS/-- (08/27 1126) Antibody: NEG (08/27 1126) Rubella:  immune RPR: NON REAC (08/27 1126)  HBsAg: NEGATIVE (08/27 1126)  HIV: NON REACTIVE (08/27 1126)  GBS: Negative (10/29 0000)  1 hr Glucola 121 Genetic screening  Too late Anatomy US normal  Assessment: Kathleen Ward is a 32 y.o. [id number] with an IUP at [redacted]w[redacted]d presenting for labor  Plan: Epidural upon request Expectant management   CRESENZO-DISHMAN,Danyeal Akens 08/29/2013, 8:51 AM

## 2013-08-29 NOTE — Anesthesia Preprocedure Evaluation (Signed)
Anesthesia Evaluation  Patient identified by MRN, date of birth, ID band Patient awake    Reviewed: Allergy & Precautions, H&P , Patient's Chart, lab work & pertinent test results  Airway Mallampati: II TM Distance: >3 FB Neck ROM: full    Dental   Pulmonary  breath sounds clear to auscultation        Cardiovascular Rhythm:regular Rate:Normal     Neuro/Psych PSYCHIATRIC DISORDERS    GI/Hepatic   Endo/Other    Renal/GU      Musculoskeletal   Abdominal   Peds  Hematology   Anesthesia Other Findings   Reproductive/Obstetrics (+) Pregnancy                           Anesthesia Physical Anesthesia Plan  ASA: II  Anesthesia Plan: Epidural   Post-op Pain Management:    Induction:   Airway Management Planned:   Additional Equipment:   Intra-op Plan:   Post-operative Plan:   Informed Consent: I have reviewed the patients History and Physical, chart, labs and discussed the procedure including the risks, benefits and alternatives for the proposed anesthesia with the patient or authorized representative who has indicated his/her understanding and acceptance.     Plan Discussed with:   Anesthesia Plan Comments:         Anesthesia Quick Evaluation  

## 2013-08-29 NOTE — Progress Notes (Signed)
   Kathleen Ward is a 32 y.o. [id number] at [redacted]w[redacted]d  admitted for active labor  Subjective: Comfortable with epidural  Objective: BP 115/80  Pulse 87  Temp(Src) 97.9 Kathleen (36.6 C) (Oral)  Resp 18  Ht 5\' 1"  (1.549 m)  Wt 77.293 kg (170 lb 6.4 oz)  BMI 32.21 kg/m2  SpO2 98%  LMP 10/29/2012    FHT:  FHR: 140 bpm, variability: moderate,  accelerations:  Present,  decelerations:  Absent UC:   regular, every 3 minutes SVE:   Dilation: 8 Effacement (%): 90 Station: -2 Exam by:: Kathleen Ward Pitocin @ 6 mu/min  Labs: Lab Results  Component Value Date   WBC 8.6 08/29/2013   HGB 13.7 08/29/2013   HCT 39.7 08/29/2013   MCV 90.6 08/29/2013   PLT 243 08/29/2013    Assessment / Plan: Augmentation of labor, progressing well AROM, no fluid seen Labor: Progressing normally Fetal Wellbeing:  Category I Pain Control:  Epidural Anticipated MOD:  NSVD  CRESENZO-DISHMAN,Kathleen Ward 08/29/2013, 3:22 PM

## 2013-08-30 ENCOUNTER — Inpatient Hospital Stay (HOSPITAL_COMMUNITY): Admission: RE | Admit: 2013-08-30 | Payer: Medicaid Other | Source: Ambulatory Visit

## 2013-08-30 NOTE — Progress Notes (Signed)
I spoke with and examined patient and agree with resident's note and plan of care.  Keandre Linden Ryan Bradi Arbuthnot, MD OB Fellow 08/30/2013 9:25 AM   
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
## 2013-08-30 NOTE — Anesthesia Postprocedure Evaluation (Signed)
Anesthesia Post Note  Patient: Kathleen Ward  Procedure(s) Performed: * No procedures listed *  Anesthesia type: Epidural  Patient location: Mother/Baby  Post pain: Pain level controlled  Post assessment: Post-op Vital signs reviewed  Last Vitals:  Filed Vitals:   08/30/13 0630  BP: 112/69  Pulse: 89  Temp: 36.8 C  Resp: 18    Post vital signs: Reviewed  Level of consciousness:alert  Complications: No apparent anesthesia complications

## 2013-08-30 NOTE — Progress Notes (Signed)
UR completed 

## 2013-08-30 NOTE — Progress Notes (Signed)
Post Partum Day 1 Subjective: no complaints, up ad lib, voiding, tolerating PO and Bleeding decreasing, changing pads q3-4h. Breastfeeding with good latch.   Objective: Blood pressure 112/69, pulse 89, temperature 98.2 F (36.8 C), temperature source Oral, resp. rate 18, height 5\' 1"  (1.549 m), weight 77.293 kg (170 lb 6.4 oz), last menstrual period 10/29/2012, SpO2 98.00%, unknown if currently breastfeeding.  Physical Exam:  General: alert, cooperative and no distress Lochia: appropriate Uterine Fundus: firm Incision: n/a DVT Evaluation: No evidence of DVT seen on physical exam. Negative Homan's sign. No significant calf/ankle edema.   Recent Labs  08/29/13 0915  HGB 13.7  HCT 39.7    Assessment/Plan: Plan for discharge tomorrow, Breastfeeding and Contraception micronor.    LOS: 1 day   Kathleen Ward 08/30/2013, 7:51 AM

## 2013-08-31 MED ORDER — IBUPROFEN 600 MG PO TABS: 600.0000 mg | ORAL_TABLET | Freq: Four times a day (QID) | ORAL | Status: DC

## 2013-08-31 NOTE — Discharge Summary (Signed)
Obstetric Discharge Summary Reason for Admission: onset of labor Prenatal Procedures: ultrasound Intrapartum Procedures: spontaneous vaginal delivery Postpartum Procedures: none Complications-Operative and Postpartum: 1st degree perineal laceration repaired with 2.0 vicryl Hemoglobin  Date Value Range Status  08/29/2013 13.7  12.0 - 15.0 g/dL Final     HCT  Date Value Range Status  08/29/2013 39.7  36.0 - 46.0 % Final   Brief Hospital Course:  Kathleen Ward is a 32 y.o. now [id number] PPD #2 s/p NSVD at [redacted]w[redacted]d who presented with contractions and cervical change. She delivered a healthy baby girl after expectant management and has had an uneventful postpartum course. She will continue breast feeding and desires micronor for contraception. She will follow up at [facility] where she received [facility]   Physical Exam:  General: alert, cooperative and no distress Lochia: appropriate Uterine Fundus: firm Incision: n/a DVT Evaluation: No evidence of DVT seen on physical exam. No significant calf/ankle edema.  Discharge Diagnoses: Post-date pregnancy delivered without induction.   Discharge Information: Date: 08/31/2013 Activity: pelvic rest Diet: routine Medications: PNV and Ibuprofen Condition: stable Instructions: refer to practice specific booklet Discharge to: home Follow-up Information   Follow up with [facility] In 6 weeks.   Specialty:  Obstetrics and Gynecology   Contact information:   [address] [place] [address] [phone]      Newborn Data: Live born female  Birth Weight: 7 lb 10.6 oz (3475 g) APGAR: 8, 9  Home with parents.  Kathleen Ward 08/31/2013, 8:03 AM  I have seen and examined this patient and I agree with the above. Kathleen Ward 9:59 PM 09/01/2013

## 2013-09-05 NOTE — Discharge Summary (Signed)
Attestation of Attending Supervision of Advanced Practitioner: Evaluation and management procedures were performed by the PA/NP/CNM/OB Fellow under my supervision/collaboration. Chart reviewed and agree with management and plan.  Ajai Harville V 09/05/2013 11:38 AM   

## 2013-09-10 ENCOUNTER — Encounter: Payer: Self-pay | Admitting: Advanced Practice Midwife

## 2013-09-19 IMAGING — US US OB COMP +14 WK
1 series · 12 of 28 positions shown · non-contrast
Comparison: none

[Series 1: us ob comp +14 wk · 12 of 76 slices shown]
[im 3/76]
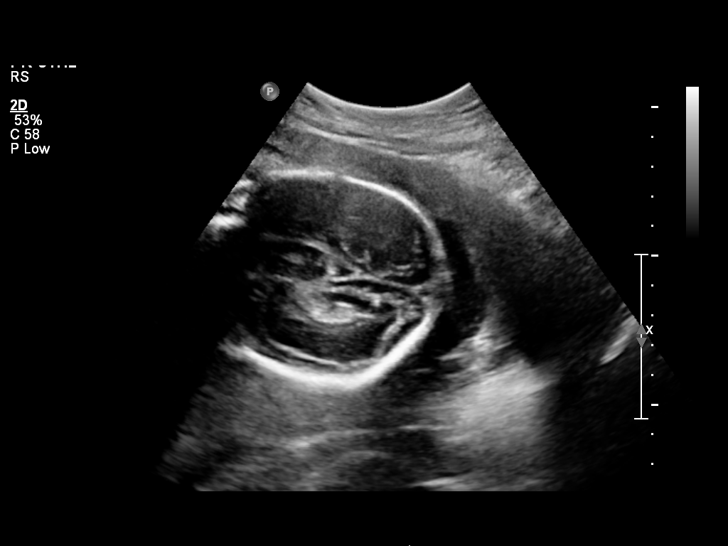
[im 9/76]
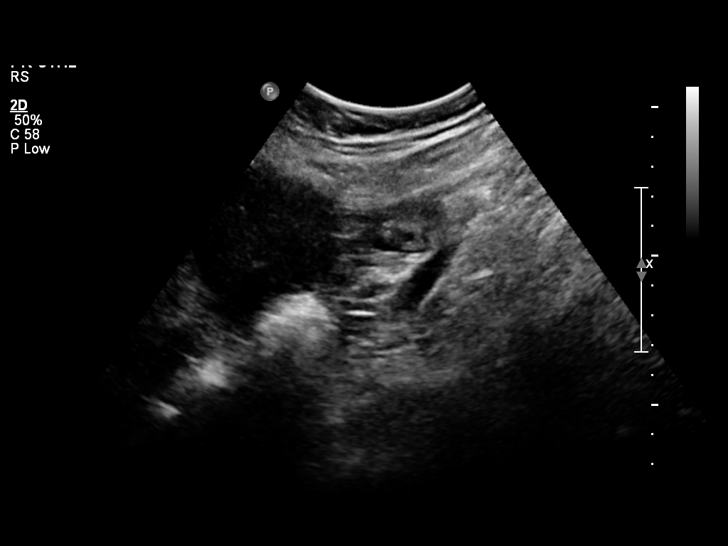
[im 14/76]
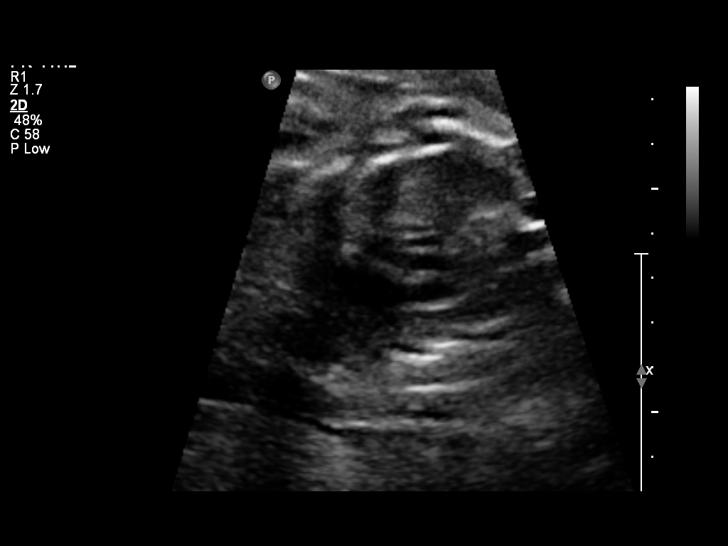
[im 23/76]
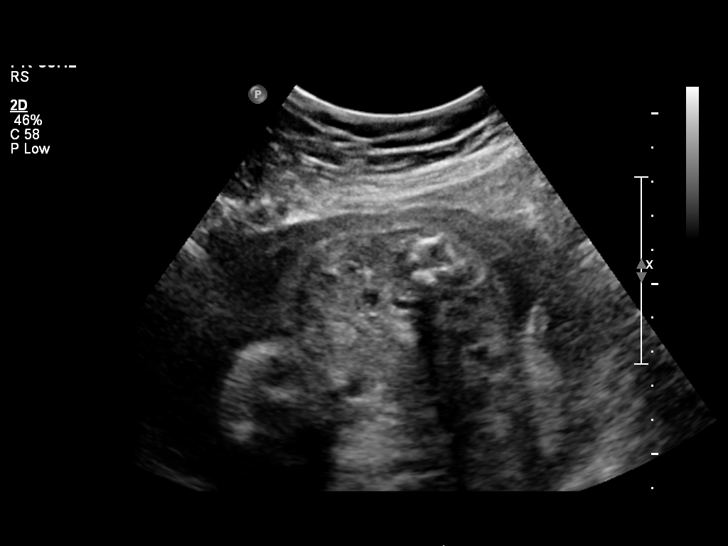
[im 28/76]
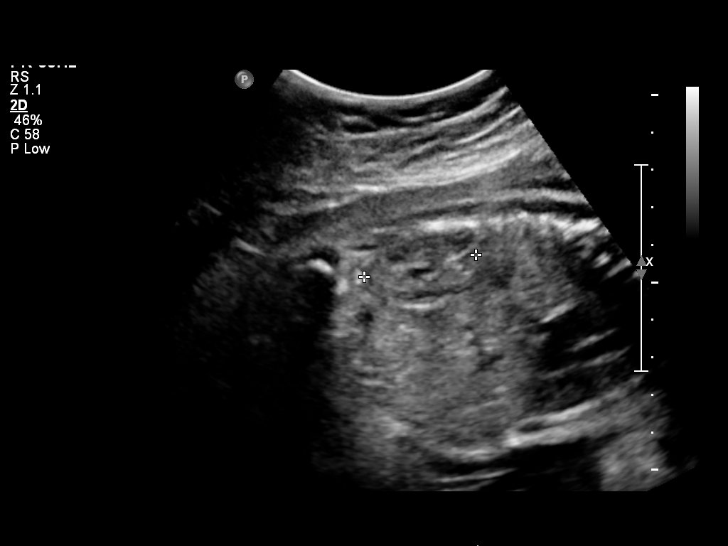
[im 34/76]
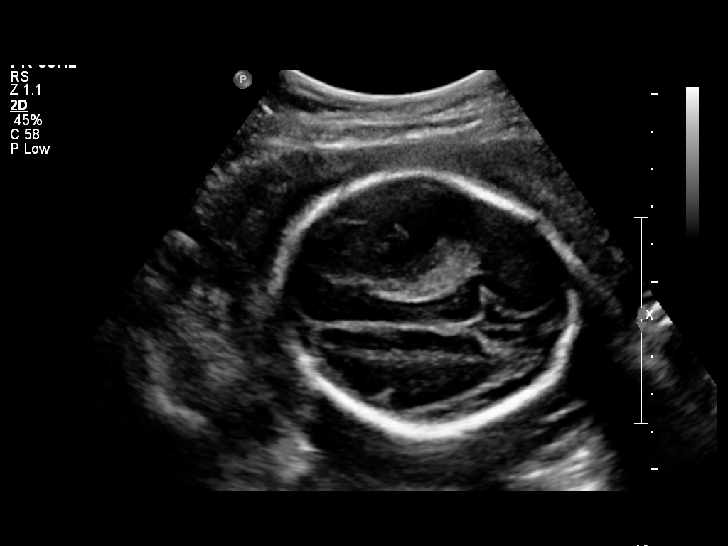
[im 42/76]
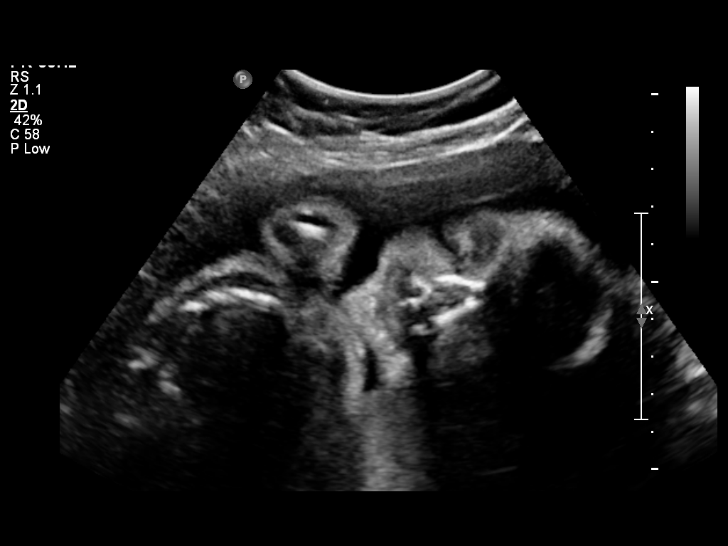
[im 48/76]
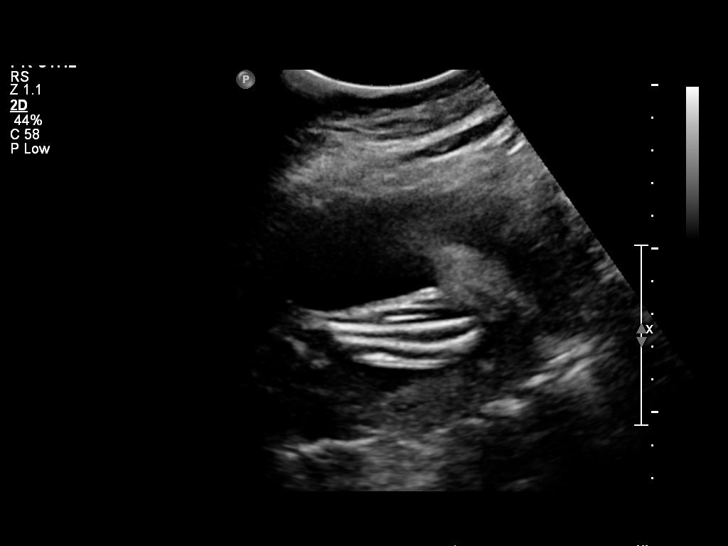
[im 53/76]
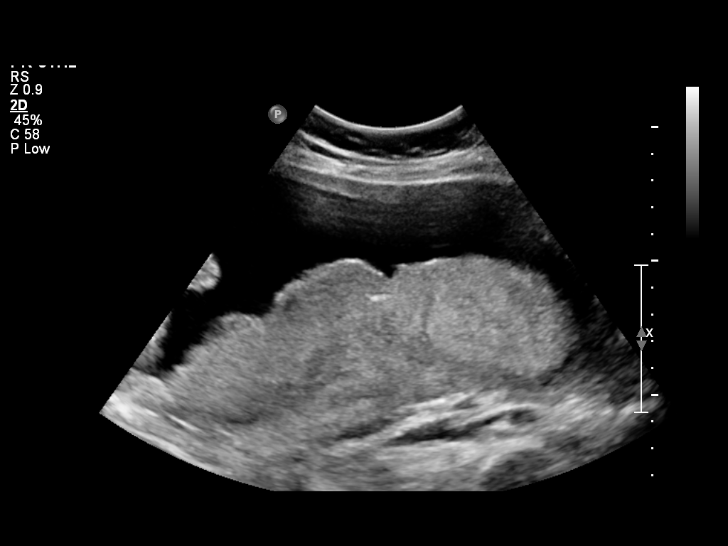
[im 62/76]
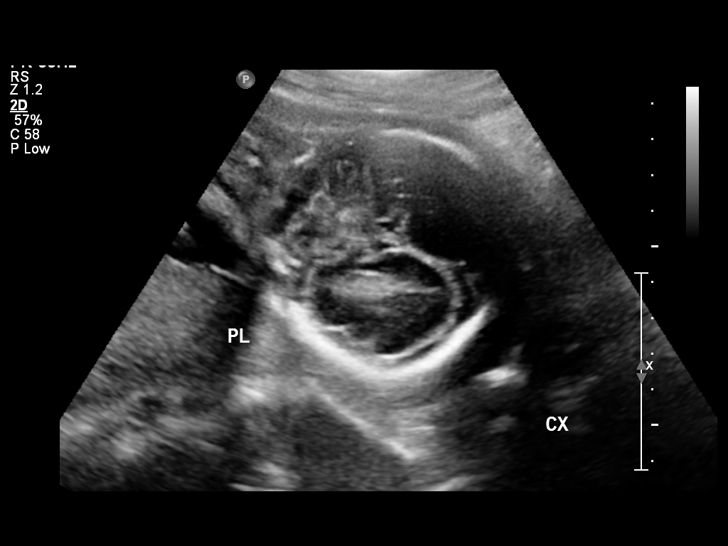
[im 67/76]
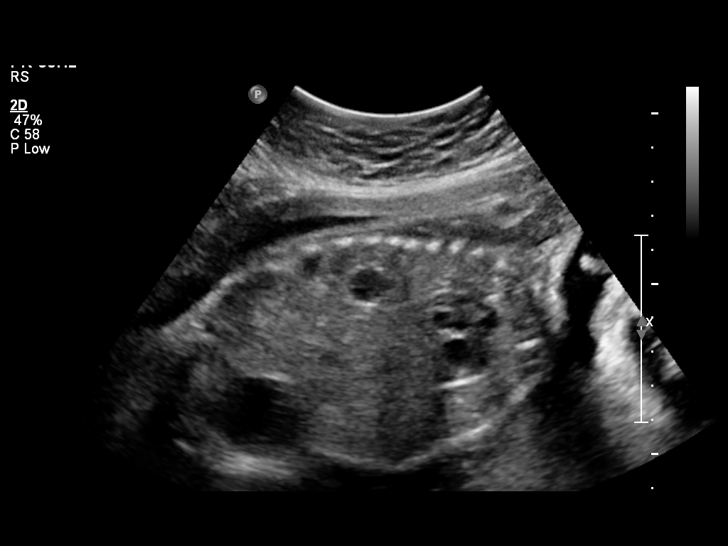
[im 73/76]
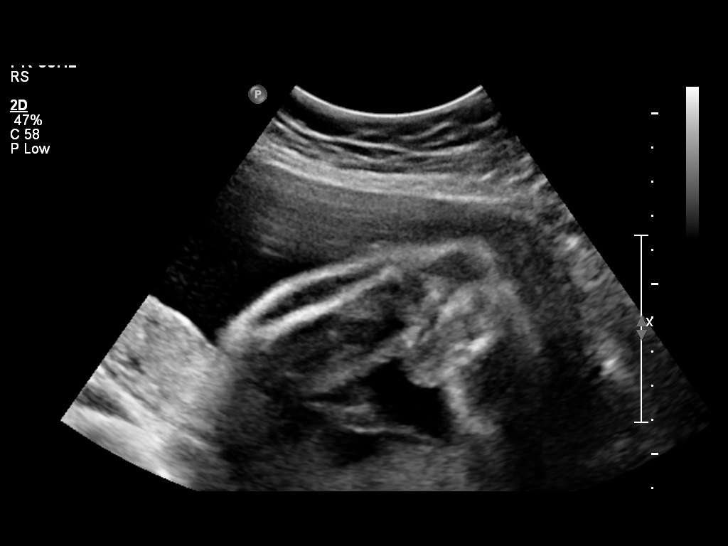

[12 of 28 positions shown; findings below may reference images not displayed]

OBSTETRICS REPORT
                      (Signed Final 05/30/2013 [DATE])

Service(s) Provided

 US OB COMP + 14 WK                                    76805.1
Indications

 No or Little Prenatal Care
 Basic anatomic survey
 Poor obstetric history: Previous preeclampsia /
 eclampsia/gestational HTN
Fetal Evaluation

 Num Of Fetuses:    1
 Fetal Heart Rate:  140                         bpm
 Cardiac Activity:  Observed
 Presentation:      Cephalic
 Placenta:          Posterior, above cervical
                    os
 P. Cord            Visualized
 Insertion:

 Amniotic Fluid
 AFI FV:      Subjectively within normal limits
                                             Larg Pckt:     5.2  cm
Biometry

 BPD:     71.1  mm    G. Age:   28w 4d                CI:         77.7   70 - 86
                                                      FL/HC:      20.7   18.8 -

 HC:     255.3  mm    G. Age:   27w 5d       15  %    HC/AC:      1.10   1.05 -

 AC:     231.9  mm    G. Age:   27w 4d       29  %    FL/BPD:     74.3   71 - 87
 FL:      52.8  mm    G. Age:   28w 1d       37  %    FL/AC:      22.8   20 - 24

 Est. FW:    8826  gm      2 lb 8 oz     47  %
Gestational Age

 LMP:           30w 3d       Date:   10/29/12                 EDD:   08/05/13
 U/S Today:     28w 0d                                        EDD:   08/22/13
 Best:          28w 0d    Det. By:   U/S (05/30/13)           EDD:   08/22/13
Anatomy
 Cranium:          Appears normal         Aortic Arch:      Basic anatomy
                                                            exam per order
 Fetal Cavum:      Appears normal         Ductal Arch:      Basic anatomy
                                                            exam per order
 Ventricles:       Appears normal         Diaphragm:        Appears normal
 Choroid Plexus:   Appears normal         Stomach:          Appears normal, left
                                                            sided
 Cerebellum:       Appears normal         Abdomen:          Appears normal
 Posterior Fossa:  Appears normal         Abdominal Wall:   Not well visualized
 Nuchal Fold:      Not applicable (>20    Cord Vessels:     Appears normal (3
                   wks GA)                                  vessel cord)
 Face:             Basic anatomy          Kidneys:          Appear normal
                   exam per order
 Lips:             Appears normal         Bladder:          Appears normal
 Heart:            Appears normal         Spine:            Appears normal
                   (4CH, axis, and
                   situs)
 RVOT:             Appears normal         Lower             Appears normal
                                          Extremities:
 LVOT:             Appears normal         Upper             Appears normal
                                          Extremities:

 Other:  Female gender. Technically difficult due to advanced GA and fetal
         position.
Cervix Uterus Adnexa

 Cervical Length:   4.1       cm

 Cervix:       Normal appearance by transabdominal scan.
 Left Ovary:   Within normal limits.
 Right Ovary:  Within normal limits.

 Adnexa:     No abnormality visualized.
Impression

 Single living IUP with US Gest. Age of 28w 0d. This is behind
 LMP; recommend assigned US EDD of 08/22/2013, and
 follow-up US in 4 wks to confirm linear fetal growth.
 Suboptimal visualization of anatomy due to advanced GA,
 however no fetal anomalies seen involving visualized
 anatomy.
 Normal amniotic fluid volume. Normal cervical length.

## 2013-10-09 ENCOUNTER — Ambulatory Visit (INDEPENDENT_AMBULATORY_CARE_PROVIDER_SITE_OTHER): Payer: Medicaid Other | Admitting: Advanced Practice Midwife

## 2013-10-09 ENCOUNTER — Encounter: Payer: Self-pay | Admitting: Advanced Practice Midwife

## 2013-10-09 VITALS — BP 122/87 | HR 77 | Temp 98.6°F | Resp 20 | Ht 61.0 in | Wt 150.6 lb

## 2013-10-09 DIAGNOSIS — G5601 Carpal tunnel syndrome, right upper limb: Secondary | ICD-10-CM

## 2013-10-09 DIAGNOSIS — G5600 Carpal tunnel syndrome, unspecified upper limb: Secondary | ICD-10-CM

## 2013-10-09 DIAGNOSIS — Z3009 Encounter for other general counseling and advice on contraception: Secondary | ICD-10-CM

## 2013-10-09 MED ORDER — WRIST SPLINT MISC: 1.0000 | Freq: Every day | Status: AC

## 2013-10-09 MED ORDER — NORGESTIMATE-ETH ESTRADIOL 0.25-35 MG-MCG PO TABS: 1.0000 | ORAL_TABLET | Freq: Every day | ORAL | Status: DC

## 2013-10-09 NOTE — Progress Notes (Signed)
Pt desires Rx for OCP's

## 2013-10-09 NOTE — Progress Notes (Signed)
  Subjective:     Kathleen MilchYahayra Ward is a 33 y.o. female who presents for a postpartum visit. She is 6 weeks postpartum following a spontaneous vaginal delivery. I have fully reviewed the prenatal and intrapartum course. The delivery was at 41 gestational weeks. Outcome: spontaneous vaginal delivery. Anesthesia: epidural. Postpartum course has been normal. Baby's course has been normal. Baby is feeding by bottle. Bleeding no bleeding. Bowel function is some constipation, improves with diet change. Bladder function is normal. Patient is sexually active. Contraception method is OCP (estrogen/progesterone). Postpartum depression screening: negative.  The following portions of the patient's history were reviewed and updated as appropriate: allergies, current medications, past family history, past medical history, past social history, past surgical history and problem list.  Review of Systems A comprehensive review of systems was negative.   Objective:    BP 122/87  Pulse 77  Temp(Src) 98.6 F (37 C) (Oral)  Resp 20  Ht 5\' 1"  (1.549 m)  Wt 150 lb 9.6 oz (68.312 kg)  BMI 28.47 kg/m2  Breastfeeding? No  General:  alert, cooperative and no distress   Breasts:  inspection negative, no nipple discharge or bleeding, no masses or nodularity palpable  Lungs: clear to auscultation bilaterally  Heart:  regular rate and rhythm, S1, S2 normal, no murmur, click, rub or gallop  Abdomen: soft, non-tender; bowel sounds normal; no masses,  no organomegaly   Vulva:  normal  Vagina: normal vagina, no sutures visible, tissue pink, well-healed  Cervix:  not evaluated  Corpus: not examined  Adnexa:  not evaluated  Rectal Exam: Normal rectovaginal exam and no hemorrhoids internal or external        Assessment:     Normal postpartum exam.    Plan:    1. Contraception: OCP (estrogen/progesterone) 2.  Pt having wrist pain in am, improves during day.  Had pain in both wrists during pregnancy, remains in right  wrist.  Wrist splint prescribed--discussed wrapping wrist to immobilize during sleep if splint cost-prohibitive. 2. Follow up as needed.

## 2013-12-18 IMAGING — US US FETAL BPP W/O NONSTRESS
1 series · 8 of 8 positions shown · non-contrast
Comparison: none

[Series 1: us fetal bpp w/o nonstress · non-contrast · 8 acquisitions, 8 frames shown]
[im 1/8]
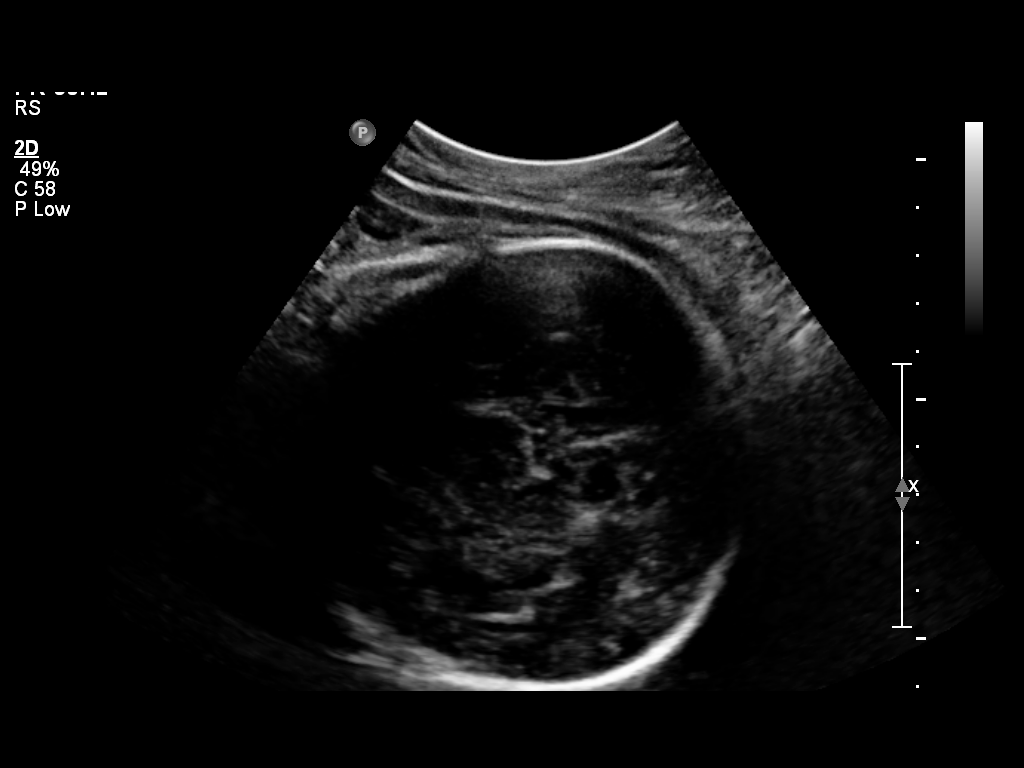
[im 2/8]
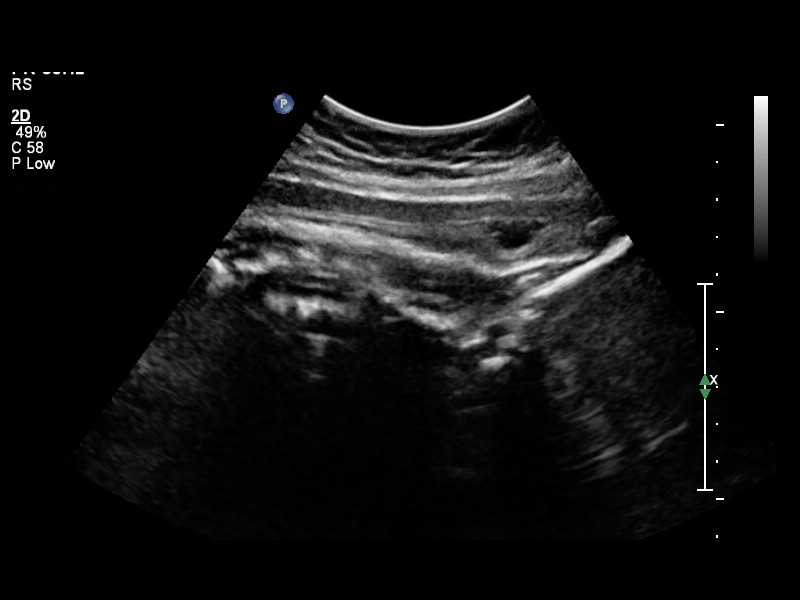
[im 3/8]
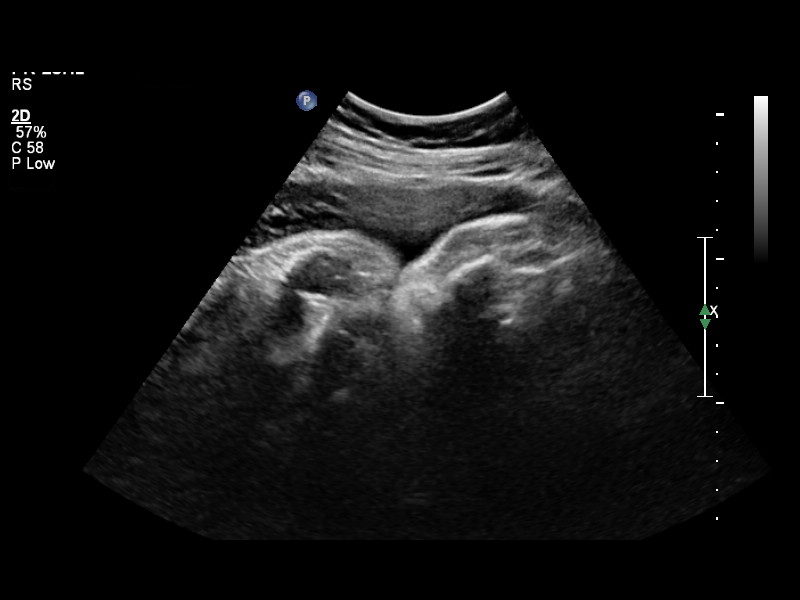
[im 4/8]
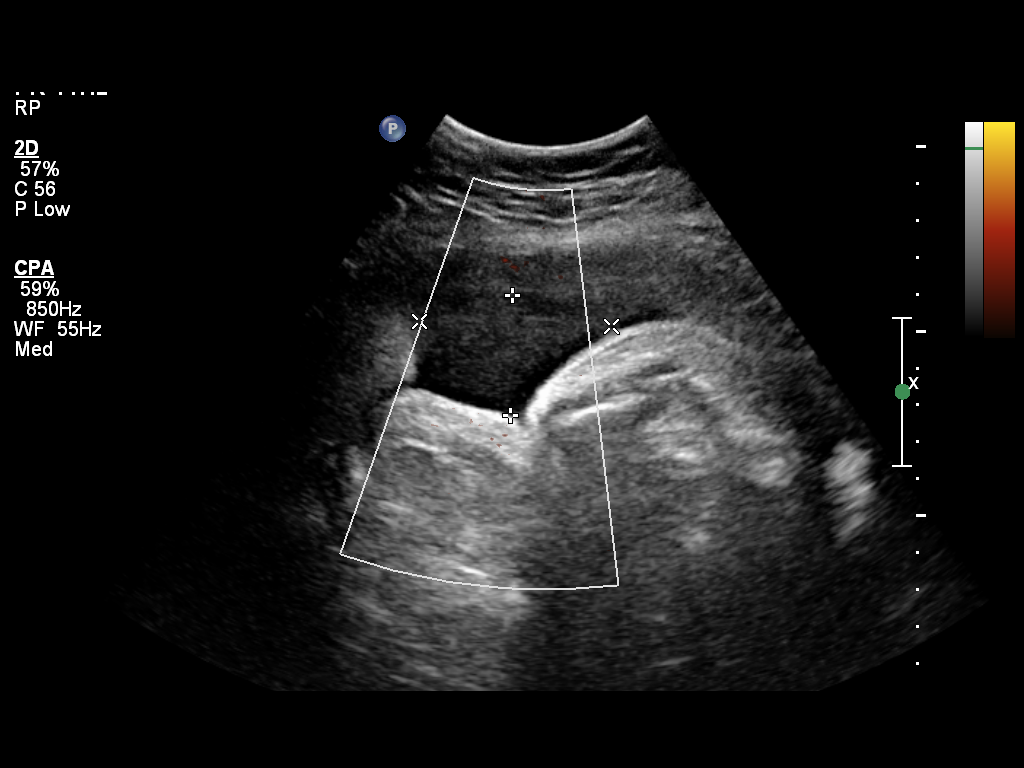
[im 5/8]
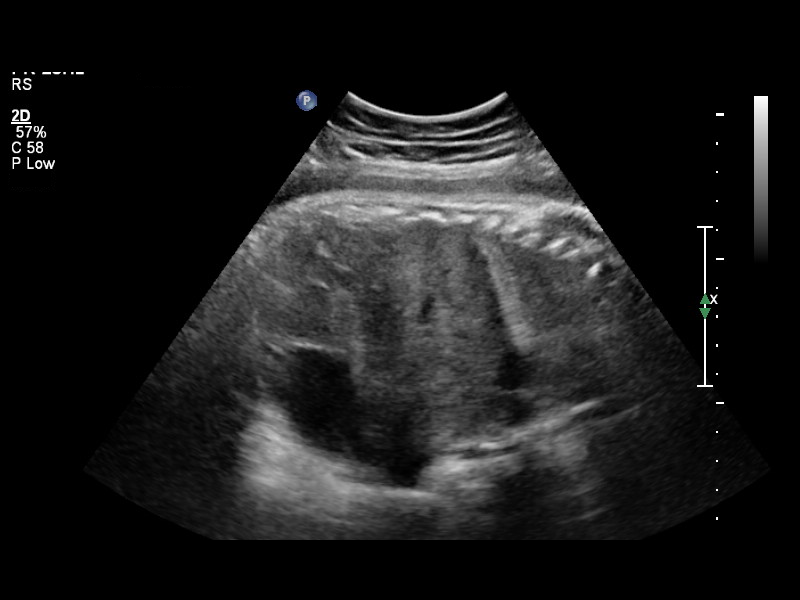
[im 6/8]
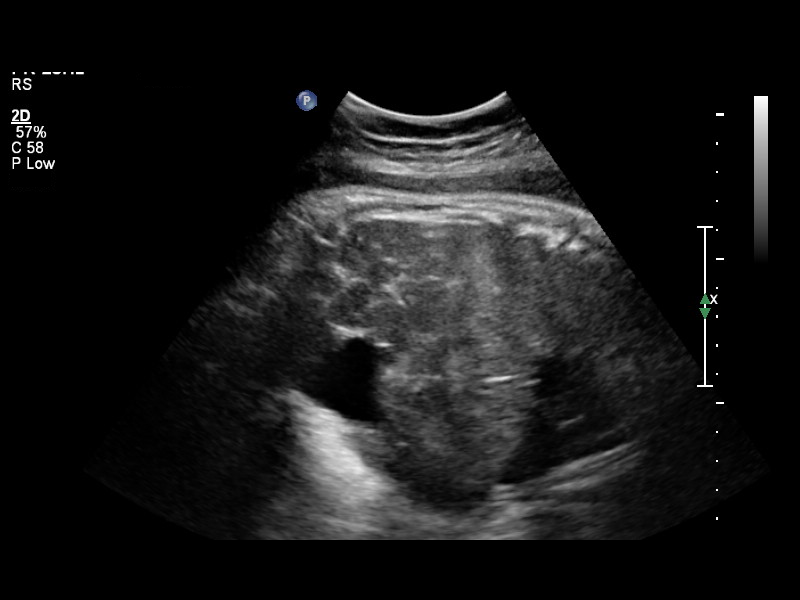
[im 7/8]
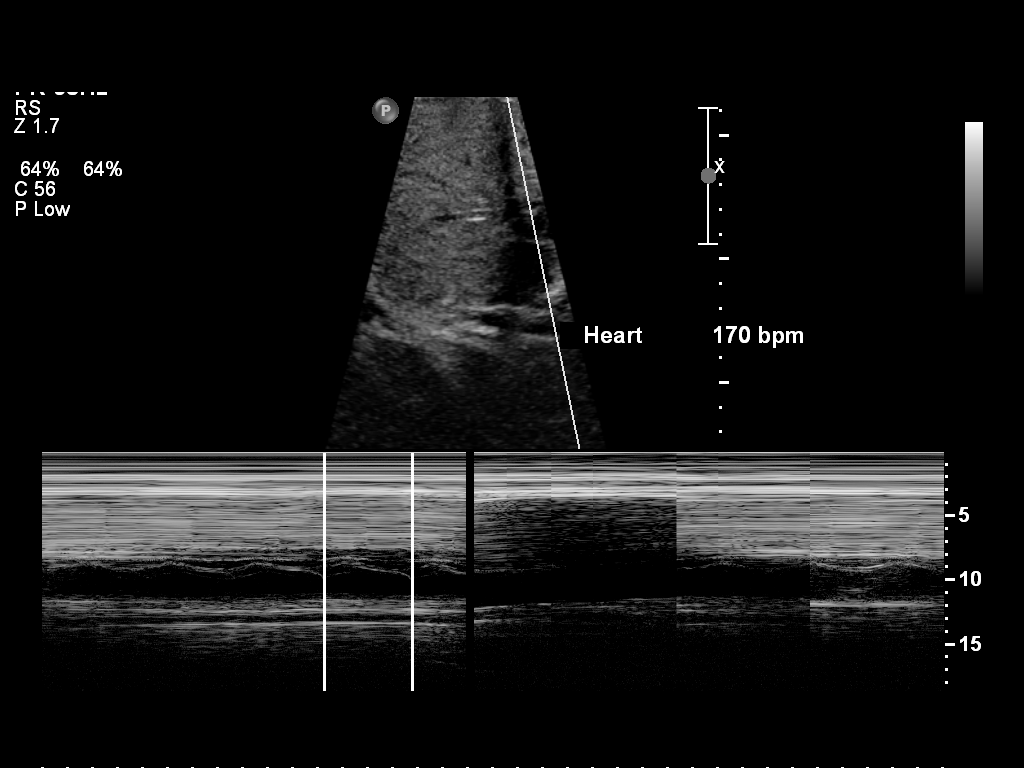
[im 8/8]
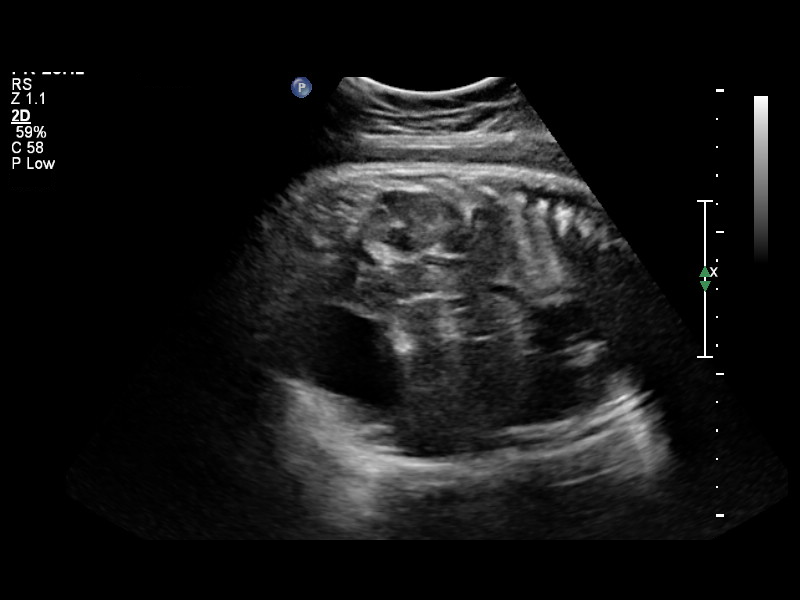

[8 of 8 positions shown; findings below may reference images not displayed]

OBSTETRICS REPORT
                      (Signed Final 08/28/2013 [DATE])

Service(s) Provided

Indications

 Postdate pregnancy (40-42 weeks)
 No or Little Prenatal Care
 Poor obstetric history: Previous preeclampsia /
 eclampsia/gestational HTN
Fetal Evaluation

 Num Of Fetuses:    1
 Fetal Heart Rate:  170                          bpm
 Cardiac Activity:  Observed
 Presentation:      Cephalic

 Amniotic Fluid
 AFI FV:      Subjectively within normal limits
                                             Larg Pckt:    3.24  cm
Biophysical Evaluation

 Amniotic F.V:   Pocket => 2 cm two         F. Tone:        Observed
                 planes
 F. Movement:    Observed                   Score:          [DATE]
 F. Breathing:   Observed
Gestational Age

 LMP:           43w 2d        Date:  10/29/12                 EDD:   08/05/13
 Best:          40w 6d     Det. By:  U/S (05/30/13)           EDD:   08/22/13
Impression

 IUP at 40+6 weeks
 BPP [DATE]
Recommendations

 Follow-up as clinically indicated

## 2014-04-17 ENCOUNTER — Ambulatory Visit (INDEPENDENT_AMBULATORY_CARE_PROVIDER_SITE_OTHER): Payer: Medicaid Other | Admitting: *Deleted

## 2014-04-17 ENCOUNTER — Ambulatory Visit (HOSPITAL_COMMUNITY)
Admission: RE | Admit: 2014-04-17 | Discharge: 2014-04-17 | Disposition: A | Discharge status: Home / Self Care | Payer: Medicaid Other | Source: Ambulatory Visit | Attending: Obstetrics and Gynecology | Admitting: Obstetrics and Gynecology

## 2014-04-17 VITALS — BP 122/74

## 2014-04-17 DIAGNOSIS — O3680X0 Pregnancy with inconclusive fetal viability, not applicable or unspecified: Secondary | ICD-10-CM | POA: Diagnosis present

## 2014-04-17 DIAGNOSIS — N831 Corpus luteum cyst of ovary, unspecified side: Secondary | ICD-10-CM | POA: Diagnosis not present

## 2014-04-17 DIAGNOSIS — O34599 Maternal care for other abnormalities of gravid uterus, unspecified trimester: Secondary | ICD-10-CM | POA: Diagnosis not present

## 2014-04-17 DIAGNOSIS — Z3490 Encounter for supervision of normal pregnancy, unspecified, unspecified trimester: Secondary | ICD-10-CM

## 2014-04-17 DIAGNOSIS — O208 Other hemorrhage in early pregnancy: Secondary | ICD-10-CM | POA: Insufficient documentation

## 2014-04-17 DIAGNOSIS — Z331 Pregnant state, incidental: Secondary | ICD-10-CM

## 2014-04-17 LAB — POCT PREGNANCY, URINE: Preg Test, Ur: POSITIVE — AB

## 2014-04-17 NOTE — Progress Notes (Signed)
Patient presented for pregnancy test. Result is positive. Patient is certain of her lmp which dates her a 508w0d. To [facility] for scan and then to lab for prenatal labs.

## 2014-04-17 NOTE — Progress Notes (Signed)
Pt reports frequent spotting x1 month, denies pain or heavy bleeding.  Bedside US performed (abdominal probe) - no IUP visualized.  Consult with Dr. Jolayne Pantheronstant - pt to have US for viability today and HCGQ.

## 2014-04-18 ENCOUNTER — Telehealth: Payer: Self-pay | Admitting: *Deleted

## 2014-04-18 DIAGNOSIS — Z3200 Encounter for pregnancy test, result unknown: Secondary | ICD-10-CM

## 2014-04-18 LAB — HCG, QUANTITATIVE, PREGNANCY: HCG, BETA CHAIN, QUANT, S: 48739.5 m[IU]/mL

## 2014-04-18 NOTE — Telephone Encounter (Signed)
Ultrasound appointment July 29 @ 0915. Contacted patient and informed of results and ultrasound, Pt verbalizes understanding.

## 2014-04-18 NOTE — Telephone Encounter (Signed)
Message copied by Dorothyann PengHAIZLIP, Obbie Lewallen E on Fri Apr 18, 2014  8:11 AM ------      Message from: CONSTANT, Gigi GinPEGGY      Created: Fri Apr 18, 2014  8:03 AM       Please inform patient that she will need a repeat ultrasound to confirm viability of this pregnancy in 10 days. Please schedule that ultrasound. ------

## 2014-04-22 ENCOUNTER — Ambulatory Visit (HOSPITAL_COMMUNITY): Payer: Medicaid Other

## 2014-04-26 ENCOUNTER — Inpatient Hospital Stay (HOSPITAL_COMMUNITY)
Admission: AD | Admit: 2014-04-26 | Discharge: 2014-04-26 | Disposition: A | Discharge status: Home / Self Care | Payer: Medicaid Other | Source: Ambulatory Visit | Attending: Obstetrics & Gynecology | Admitting: Obstetrics & Gynecology

## 2014-04-26 ENCOUNTER — Encounter (HOSPITAL_COMMUNITY): Payer: Self-pay | Admitting: *Deleted

## 2014-04-26 ENCOUNTER — Inpatient Hospital Stay (HOSPITAL_COMMUNITY): Payer: Medicaid Other

## 2014-04-26 DIAGNOSIS — O039 Complete or unspecified spontaneous abortion without complication: Secondary | ICD-10-CM | POA: Diagnosis not present

## 2014-04-26 DIAGNOSIS — O209 Hemorrhage in early pregnancy, unspecified: Secondary | ICD-10-CM | POA: Diagnosis present

## 2014-04-26 LAB — CBC
HEMATOCRIT: 38.6 % (ref 36.0–46.0)
HEMOGLOBIN: 12.9 g/dL (ref 12.0–15.0)
MCH: 31.1 pg (ref 26.0–34.0)
MCHC: 33.4 g/dL (ref 30.0–36.0)
MCV: 93 fL (ref 78.0–100.0)
Platelets: 231 10*3/uL (ref 150–400)
RBC: 4.15 MIL/uL (ref 3.87–5.11)
RDW: 13.2 % (ref 11.5–15.5)
WBC: 5.8 10*3/uL (ref 4.0–10.5)

## 2014-04-26 LAB — TSH: TSH: 3.07 u[IU]/mL (ref 0.350–4.500)

## 2014-04-26 MED ORDER — MISOPROSTOL 200 MCG PO TABS
800.0000 ug | ORAL_TABLET | ORAL | Status: AC
  Administered 2014-04-26: 800 ug via VAGINAL
  Filled 2014-04-26: qty 4

## 2014-04-26 MED ORDER — OXYCODONE-ACETAMINOPHEN 5-325 MG PO TABS: 1.0000 | ORAL_TABLET | ORAL | Status: AC | PRN

## 2014-04-26 MED ORDER — IBUPROFEN 800 MG PO TABS
800.0000 mg | ORAL_TABLET | Freq: Once | ORAL | Status: AC
  Administered 2014-04-26: 800 mg via ORAL
  Filled 2014-04-26: qty 1

## 2014-04-26 MED ORDER — IBUPROFEN 600 MG PO TABS: 600.0000 mg | ORAL_TABLET | Freq: Four times a day (QID) | ORAL | Status: DC | PRN

## 2014-04-26 NOTE — MAU Provider Note (Signed)
Chief Complaint: Vaginal Bleeding   First Provider Initiated Contact with Patient 04/26/14 1840     SUBJECTIVE HPI: Kathleen Ward is a 33 y.o. [id number] at [redacted]w[redacted]d by MSD 9 days ago ([redacted]w[redacted]d by unsure LMP) who presents with heavy vaginal bleeding with clots for the past 2 hrs. Saturated 3 large pads. She has menstrual-like cramping and LBP. Has not seen tissue passed. No orthostatic sx. Last ate @1400 .  In January 2015 she began combination OCPs prescribed at her 6 week postpartum visit. She stopped taking them 2 months ago due to nausea and she attributed to the pills. On 04/17/2014 she presented for new OB visit with history of spotting x1 month and no IUP was seen on ultrasound at [facility]. Her quant that day was 48,740 and ultrasound showed IUGS consistent with 7 week 4 day gestation and no yolk sac; small SCH  O pos.  05/29/13: TSH 1.4 and FT4 .77  Past Medical History  Diagnosis Date  . Thyroid disease   . Anxiety   . Hx of preeclampsia, prior pregnancy, currently pregnant 2004  . PROM (premature rupture of membranes) 2004  . 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 Wisdom tooth extraction    . Abortion      3 previous abortions   History   Social History  . Marital Status: Single    Spouse Name: N/A    Number of Children: N/A  . Years of Education: N/A   Occupational History  . Not on file.   Social History Main Topics  .  Smoking status: Never Smoker   . Smokeless tobacco: Never Used  . Alcohol Use: No  . Drug Use: No  . Sexual Activity: Yes    Birth Control/ Protection: Condom     Comment: desires OCP's   Other Topics Concern  . Not on file   Social History Narrative  . No narrative on file   No current facility-administered medications on file prior to encounter.   Current Outpatient Prescriptions on File Prior to Encounter  Medication Sig Dispense Refill  . Prenatal Vit-Fe Fumarate-FA (PRENATAL MULTIVITAMIN) TABS tablet Take 1 tablet by mouth daily at 12 noon.      . Elastic Bandages & Supports (WRIST SPLINT) MISC 1 Device by Does not apply route daily.  1 each  0   No Known Allergies  ROS: Pertinent items in HPI  OBJECTIVE Blood pressure 140/83, pulse 85, temperature  98 F (36.7 C), temperature source Oral, resp. rate 20, height 4\' 9"  (1.448 m), weight 65.772 kg (145 lb), last menstrual period 02/13/2014, not currently breastfeeding. GENERAL: Well-developed, well-nourished female anxious, in no acute distress.  HEENT: Normocephalic HEART: normal rate RESP: normal effort ABDOMEN: Soft, non-tender EXTREMITIES: Nontender, no edema NEURO: Alert and oriented SPECULUM EXAM: heavy BRB and clots up to 5-6 cm extracted from cx and vagina via sponge forceps and swabs; bleeding slowed to tricle BIMANUAL: cervix loose 1cm ; uterus slightly enlarged and minimally tender  LAB RESULTS Results for orders placed during the hospital encounter of 04/26/14 (from the past 24 hour(s))  CBC     Status: None   Collection Time    04/26/14  7:05 PM      Result Value Ref Range   WBC 5.8  4.0 - 10.5 K/uL   RBC 4.15  3.87 - 5.11 MIL/uL   Hemoglobin 12.9  12.0 - 15.0 g/dL   HCT 40.938.6  81.136.0 - 91.446.0 %   MCV 93.0  78.0 - 100.0 fL   MCH 31.1  26.0 - 34.0 pg   MCHC 33.4  30.0 - 36.0 g/dL   RDW 78.213.2  95.611.5 - 21.315.5 %   Platelets 231  150 - 400 K/uL    IMAGING [place] Ob Comp Less 14 Wks  04/17/2014   CLINICAL DATA:   Pregnancy with inconclusive fetal viability. Uncertain LMP.  EXAM: OBSTETRIC <14 WK US AND TRANSVAGINAL OB US  TECHNIQUE: Both transabdominal and transvaginal ultrasound examinations were performed for complete evaluation of the gestation as well as the maternal uterus, adnexal regions, and pelvic cul-de-sac. Transvaginal technique was performed to assess early pregnancy.  COMPARISON:  None.  FINDINGS: Intrauterine gestational sac: Visualized/normal in shape.  Yolk sac:  Not visualized  Embryo:  Not visualized  MSD:  24  mm   7 w   4  d  Maternal uterus/adnexae: Small subchorionic hemorrhage noted. Small right ovarian corpus luteum cyst also noted. Left ovary appears normal. No evidence of adnexal mass or free fluid.  IMPRESSION: Findings are suspicious but not yet definitive for failed pregnancy. Recommend follow-up US in 10-14 days for definitive diagnosis. This recommendation follows SRU consensus guidelines: Diagnostic Criteria for Nonviable Pregnancy Early in the First Trimester. Malva Limes Engl J Med 2013; 086:5784-69; 369:1443-51.  Small subchorionic hemorrhage.   Electronically Signed   By: Myles RosenthalJohn  Stahl M.D.   On: 04/17/2014 16:19   [place] Ob Transvaginal  04/17/2014   CLINICAL DATA:  Pregnancy with inconclusive fetal viability. Uncertain LMP.  EXAM: OBSTETRIC <14 WK US AND TRANSVAGINAL OB US  TECHNIQUE: Both transabdominal and transvaginal ultrasound examinations were performed for complete evaluation of the gestation as well as the maternal uterus, adnexal regions, and pelvic cul-de-sac. Transvaginal technique was performed to assess early pregnancy.  COMPARISON:  None.  FINDINGS: Intrauterine gestational sac: Visualized/normal in shape.  Yolk sac:  Not visualized  Embryo:  Not visualized  MSD:  24  mm   7 w   4  d  Maternal uterus/adnexae: Small subchorionic hemorrhage noted. Small right ovarian corpus luteum cyst also noted. Left ovary appears normal. No evidence of adnexal mass or free fluid.  IMPRESSION: Findings are  suspicious but not yet definitive for failed pregnancy. Recommend follow-up US in 10-14 days for definitive diagnosis. This recommendation follows SRU consensus guidelines: Diagnostic Criteria for Nonviable Pregnancy Early in the First Trimester. Malva Limes Engl J Med 2013; 629:5284-13; 369:1443-51.  Small subchorionic hemorrhage.   Electronically Signed   By:  Myles Rosenthal M.D.   On: 04/17/2014 16:19    MAU COURSE C/W Dr. Macon Large expectant management TSH sent  ASSESSMENT 1. SAB (spontaneous abortion)     PLAN Discharge home with analgesics, expectant management, bleeding precautions    Medication List         ibuprofen 600 MG tablet  Commonly known as:  ADVIL,MOTRIN  Take 1 tablet (600 mg total) by mouth every 6 (six) hours as needed.     oxyCODONE-acetaminophen 5-325 MG per tablet  Commonly known as:  PERCOCET/ROXICET  Take 1 tablet by mouth every 4 (four) hours as needed.     prenatal multivitamin Tabs tablet  Take 1 tablet by mouth daily at 12 noon.     Wrist Splint Misc  1 Device by Does not apply route daily.       Follow-up Information   Follow up with [facility]. (Someone from Clinic will call you with appt.)    Contact information:   [address] [place] [address] [phone]     2100: Sharen Counter to do dispo after [place] report back   Danae Orleans, CNM 04/26/2014  7:00 PM  US Ob Transvaginal  04/26/2014   CLINICAL DATA:  Heavy vaginal bleeding in a patient with positive pregnancy test.  EXAM: TRANSVAGINAL OB ULTRASOUND  TECHNIQUE: Transvaginal ultrasound was performed for complete evaluation of the gestation as well as the maternal uterus, adnexal regions, and pelvic cul-de-sac.  COMPARISON:  04/17/2014  FINDINGS: No intrauterine gestational sac visualized. There is some fluid in the endometrial canal and endometrium is heterogeneous and thickened. Prominent flow signal is seen in the anterior endometrium on color Doppler evaluation.  Maternal uterus/adnexae:  Corpus luteum cyst is seen in the right ovary. The maternal left ovary is unremarkable. No evidence for free fluid in the cul-de-sac.  IMPRESSION: Intrauterine gestational sac seen previously is no longer evident. The endometrium is heterogeneous with prominent flow signal. Retained products of conception cannot be excluded on this study.   Electronically Signed   By: Kennith Center M.D.   On: 04/26/2014 20:51    Plan revised:   Discussed U/S findings with possible POCs with Dr Macon Large. Cytotec 800 mcg pv x1 dose in MAU.  Offered as Rx, pt uncomfortable placing medication herself.   D/C home with bleeding precautions F/U in 2 weeks in [facility] sent Return to MAU as needed for emergencies  Sharen Counter Certified Nurse-Midwife

## 2014-04-26 NOTE — MAU Note (Signed)
Pt stated she is passing large clots and cramping .

## 2014-04-26 NOTE — MAU Note (Signed)
L. Leftwich-Kirby at bedside placing cytotec

## 2014-04-26 NOTE — Discharge Instructions (Signed)
Miscarriage A miscarriage is the sudden loss of an unborn baby (fetus) before the 20th week of pregnancy. Most miscarriages happen in the first 3 months of pregnancy. Sometimes, it happens before a woman even knows she is pregnant. A miscarriage is also called a "spontaneous miscarriage" or "early pregnancy loss." Having a miscarriage can be an emotional experience. Talk with your caregiver about any questions you may have about miscarrying, the grieving process, and your future pregnancy plans. CAUSES   Problems with the fetal chromosomes that make it impossible for the baby to develop normally. Problems with the baby's genes or chromosomes are most often the result of errors that occur, by chance, as the embryo divides and grows. The problems are not inherited from the parents.  Infection of the cervix or uterus.   Hormone problems.   Problems with the cervix, such as having an incompetent cervix. This is when the tissue in the cervix is not strong enough to hold the pregnancy.   Problems with the uterus, such as an abnormally shaped uterus, uterine fibroids, or congenital abnormalities.   Certain medical conditions.   Smoking, drinking alcohol, or taking illegal drugs.   Trauma.  Often, the cause of a miscarriage is unknown.  SYMPTOMS   Vaginal bleeding or spotting, with or without cramps or pain.  Pain or cramping in the abdomen or lower back.  Passing fluid, tissue, or blood clots from the vagina. DIAGNOSIS  Your caregiver will perform a physical exam. You may also have an ultrasound to confirm the miscarriage. Blood or urine tests may also be ordered. TREATMENT   Sometimes, treatment is not necessary if you naturally pass all the fetal tissue that was in the uterus. If some of the fetus or placenta remains in the body (incomplete miscarriage), tissue left behind may become infected and must be removed. Usually, a dilation and curettage (D and C) procedure is performed.  During a D and C procedure, the cervix is widened (dilated) and any remaining fetal or placental tissue is gently removed from the uterus.  Antibiotic medicines are prescribed if there is an infection. Other medicines may be given to reduce the size of the uterus (contract) if there is a lot of bleeding.  If you have Rh negative blood and your baby was Rh positive, you will need a Rh immunoglobulin shot. This shot will protect any future baby from having Rh blood problems in future pregnancies. HOME CARE INSTRUCTIONS   Your caregiver may order bed rest or may allow you to continue light activity. Resume activity as directed by your caregiver.  Have someone help with home and family responsibilities during this time.   Keep track of the number of sanitary pads you use each day and how soaked (saturated) they are. Write down this information.   Do not use tampons. Do not douche or have sexual intercourse until approved by your caregiver.   Only take over-the-counter or prescription medicines for pain or discomfort as directed by your caregiver.   Do not take aspirin. Aspirin can cause bleeding.   Keep all follow-up appointments with your caregiver.   If you or your partner have problems with grieving, talk to your caregiver or seek counseling to help cope with the pregnancy loss. Allow enough time to grieve before trying to get pregnant again.  SEEK IMMEDIATE MEDICAL CARE IF:   You have severe cramps or pain in your back or abdomen.  You have a fever.  You pass large blood clots (walnut-sized   or larger) ortissue from your vagina. Save any tissue for your caregiver to inspect.   Your bleeding increases.   You have a thick, bad-smelling vaginal discharge.  You become lightheaded, weak, or you faint.   You have chills.  MAKE SURE YOU:  Understand these instructions.  Will watch your condition.  Will get help right away if you are not doing well or get  worse. Document Released: 03/15/2001 Document Revised: 01/14/2013 Document Reviewed: 11/08/2011 ExitCare Patient Information 2015 [employer]. This information is not intended to replace advice given to you by your health care provider. Make sure you discuss any questions you have with your health care provider.  

## 2014-04-27 MED ORDER — IBUPROFEN 600 MG PO TABS: 600.0000 mg | ORAL_TABLET | Freq: Four times a day (QID) | ORAL | Status: AC | PRN

## 2014-04-27 NOTE — MAU Provider Note (Signed)
Attestation of Attending Supervision of Advanced Practitioner (PA/CNM/NP): Evaluation and management procedures were performed by the Advanced Practitioner under my supervision and collaboration.  I have reviewed the Advanced Practitioner's note and chart, and I agree with the management and plan.  Chalise Pe, MD, FACOG Attending Obstetrician & Gynecologist Faculty Practice, [facility]   

## 2014-04-30 ENCOUNTER — Ambulatory Visit (HOSPITAL_COMMUNITY): Payer: Medicaid Other

## 2014-04-30 NOTE — Progress Notes (Signed)
Patient ID: Beverly MilchYahayra Ward, female   DOB: 05/01/1981, 33 y.o.   MRN: [id number] Agree with nurses's documentation of this patient's clinic encounter.  Catalina AntiguaPeggy Amauri Keefe, MD

## 2014-05-15 ENCOUNTER — Encounter: Payer: Self-pay | Admitting: Obstetrics & Gynecology

## 2014-05-15 ENCOUNTER — Ambulatory Visit (INDEPENDENT_AMBULATORY_CARE_PROVIDER_SITE_OTHER): Payer: Medicaid Other | Admitting: Obstetrics & Gynecology

## 2014-05-15 VITALS — BP 116/72 | HR 88 | Ht 61.0 in | Wt 149.3 lb

## 2014-05-15 DIAGNOSIS — O039 Complete or unspecified spontaneous abortion without complication: Secondary | ICD-10-CM

## 2014-05-15 NOTE — Patient Instructions (Signed)
Miscarriage A miscarriage is the sudden loss of an unborn baby (fetus) before the 20th week of pregnancy. Most miscarriages happen in the first 3 months of pregnancy. Sometimes, it happens before a woman even knows she is pregnant. A miscarriage is also called a "spontaneous miscarriage" or "early pregnancy loss." Having a miscarriage can be an emotional experience. Talk with your caregiver about any questions you may have about miscarrying, the grieving process, and your future pregnancy plans. CAUSES   Problems with the fetal chromosomes that make it impossible for the baby to develop normally. Problems with the baby's genes or chromosomes are most often the result of errors that occur, by chance, as the embryo divides and grows. The problems are not inherited from the parents.  Infection of the cervix or uterus.   Hormone problems.   Problems with the cervix, such as having an incompetent cervix. This is when the tissue in the cervix is not strong enough to hold the pregnancy.   Problems with the uterus, such as an abnormally shaped uterus, uterine fibroids, or congenital abnormalities.   Certain medical conditions.   Smoking, drinking alcohol, or taking illegal drugs.   Trauma.  Often, the cause of a miscarriage is unknown.  SYMPTOMS   Vaginal bleeding or spotting, with or without cramps or pain.  Pain or cramping in the abdomen or lower back.  Passing fluid, tissue, or blood clots from the vagina. DIAGNOSIS  Your caregiver will perform a physical exam. You may also have an ultrasound to confirm the miscarriage. Blood or urine tests may also be ordered. TREATMENT   Sometimes, treatment is not necessary if you naturally pass all the fetal tissue that was in the uterus. If some of the fetus or placenta remains in the body (incomplete miscarriage), tissue left behind may become infected and must be removed. Usually, a dilation and curettage (D and C) procedure is performed.  During a D and C procedure, the cervix is widened (dilated) and any remaining fetal or placental tissue is gently removed from the uterus.  Antibiotic medicines are prescribed if there is an infection. Other medicines may be given to reduce the size of the uterus (contract) if there is a lot of bleeding.  If you have Rh negative blood and your baby was Rh positive, you will need a Rh immunoglobulin shot. This shot will protect any future baby from having Rh blood problems in future pregnancies. HOME CARE INSTRUCTIONS   Your caregiver may order bed rest or may allow you to continue light activity. Resume activity as directed by your caregiver.  Have someone help with home and family responsibilities during this time.   Keep track of the number of sanitary pads you use each day and how soaked (saturated) they are. Write down this information.   Do not use tampons. Do not douche or have sexual intercourse until approved by your caregiver.   Only take over-the-counter or prescription medicines for pain or discomfort as directed by your caregiver.   Do not take aspirin. Aspirin can cause bleeding.   Keep all follow-up appointments with your caregiver.   If you or your partner have problems with grieving, talk to your caregiver or seek counseling to help cope with the pregnancy loss. Allow enough time to grieve before trying to get pregnant again.  SEEK IMMEDIATE MEDICAL CARE IF:   You have severe cramps or pain in your back or abdomen.  You have a fever.  You pass large blood clots (walnut-sized   or larger) ortissue from your vagina. Save any tissue for your caregiver to inspect.   Your bleeding increases.   You have a thick, bad-smelling vaginal discharge.  You become lightheaded, weak, or you faint.   You have chills.  MAKE SURE YOU:  Understand these instructions.  Will watch your condition.  Will get help right away if you are not doing well or get  worse. Document Released: 03/15/2001 Document Revised: 01/14/2013 Document Reviewed: 11/08/2011 Exit[employer] Patient Information 2015 [employer]. This information is not intended to replace advice given to you by your health care provider. Make sure you discuss any questions you have with your health care provider.  

## 2014-05-15 NOTE — Progress Notes (Signed)
Patient ID: Kathleen Ward Peth, female   DOB: Nov 15, 1980, 33 y.o.   MRN: [id number]  Chief Complaint  Patient presents with  . Miscarriage    HPI Kathleen Ward Sollars is a 33 y.o. female.  [id number] Patient's last menstrual period was 02/13/2014. S/p SAB at [redacted] weeks EGA after taking cytotec. No bleeding or pain now. Wants to use condoms [facility]  HPI  Past Medical History  Diagnosis Date  . Thyroid disease   . Anxiety   . Hx of preeclampsia, prior pregnancy, currently pregnant 2004  . PROM (premature rupture of membranes) 2004  . Abnormal Pap smear     Past Surgical History  Procedure Laterality Date  . Wisdom tooth extraction    . Abortion      3 previous abortions    Family History  Problem Relation Age of Onset  . Hyperlipidemia Father   . Liver disease Mother   . Thyroid disease Mother   . Fibroids Mother   . Diabetes Paternal Grandmother   . Cancer Paternal Grandmother   . Alzheimer's disease Paternal Grandmother   . Diabetes Paternal Grandfather   . Alzheimer's disease Paternal Grandfather   . Thyroid disease Sister     Social History History  Substance Use Topics  . Smoking status: Never Smoker   . Smokeless tobacco: Never Used  . Alcohol Use: No    No Known Allergies  Current Outpatient Prescriptions  Medication Sig Dispense Refill  . Prenatal Vit-Fe Fumarate-FA (PRENATAL MULTIVITAMIN) TABS tablet Take 1 tablet by mouth daily at 12 noon.      . Elastic Bandages & Supports (WRIST SPLINT) MISC 1 Device by Does not apply route daily.  1 each  0  . ibuprofen (ADVIL,MOTRIN) 600 MG tablet Take 1 tablet (600 mg total) by mouth every 6 (six) hours as needed.  30 tablet  1  . oxyCODONE-acetaminophen (PERCOCET/ROXICET) 5-325 MG per tablet Take 1 tablet by mouth every 4 (four) hours as needed.  10 tablet  0   No current facility-administered medications for this visit.    Review of Systems Review of Systems  Constitutional: Negative.   Genitourinary: Negative for  vaginal bleeding, vaginal discharge, menstrual problem and pelvic pain.    Blood pressure 116/72, pulse 88, height 5\' 1"  (1.549 m), weight 149 lb 4.8 oz (67.722 kg), last menstrual period 02/13/2014, unknown if currently breastfeeding.  Physical Exam Physical Exam  Constitutional: She is oriented to person, place, and time. She appears well-developed. No distress.  Pulmonary/Chest: Effort normal. No respiratory distress.  Neurological: She is alert and oriented to person, place, and time.  Skin: Skin is warm and dry.  Psychiatric: She has a normal mood and affect. Her behavior is normal.    Data Reviewed MAU notes and US, labs  Assessment    S/p Sab     Plan    [facility]        ARNOLD,JAMES 05/15/2014, 4:05 PM

## 2014-08-04 ENCOUNTER — Encounter: Payer: Self-pay | Admitting: Obstetrics & Gynecology

## 2014-08-07 IMAGING — US US OB COMP LESS 14 WK
1 series · 14 of 26 positions shown · non-contrast
Comparison: None.

CLINICAL DATA: Pregnancy with inconclusive fetal viability.
Uncertain LMP.

EXAM:
OBSTETRIC <14 WK US AND TRANSVAGINAL OB US
TECHNIQUE: Both transabdominal and transvaginal ultrasound examinations were
performed for complete evaluation of the gestation as well as the
maternal uterus, adnexal regions, and pelvic cul-de-sac.
Transvaginal technique was performed to assess early pregnancy.

[Series 1: us ob comp less 14 wks · 26 acquisitions, 14 frames shown]
[im 1/26]
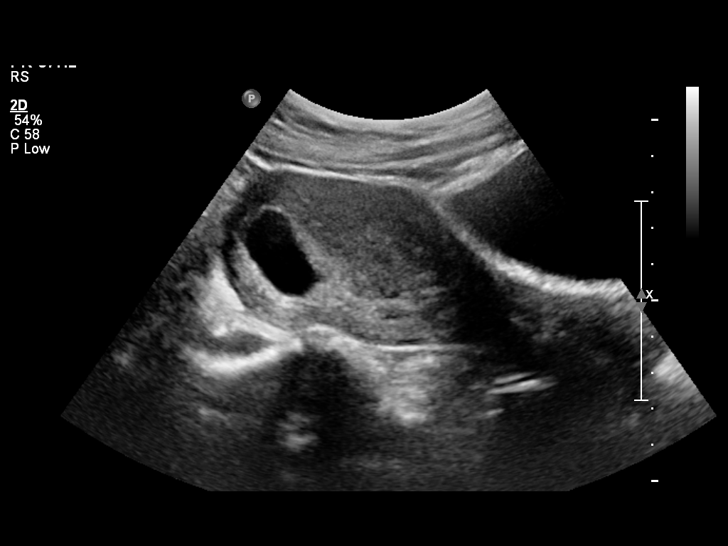
[im 3/26]
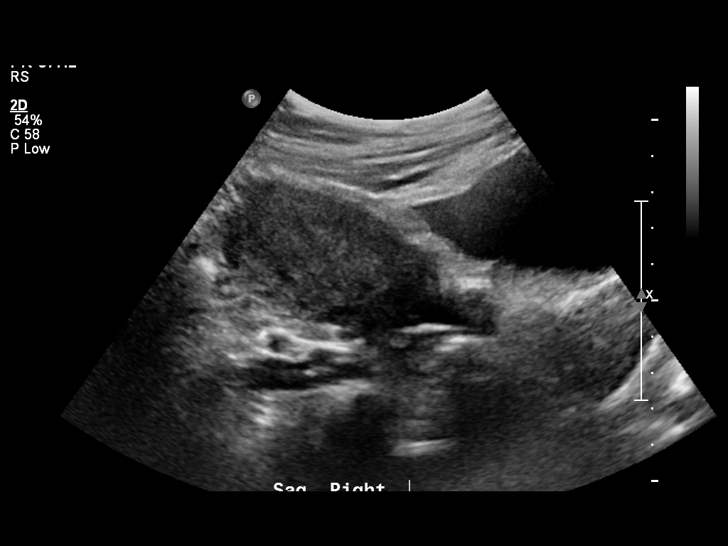
[im 5/26]
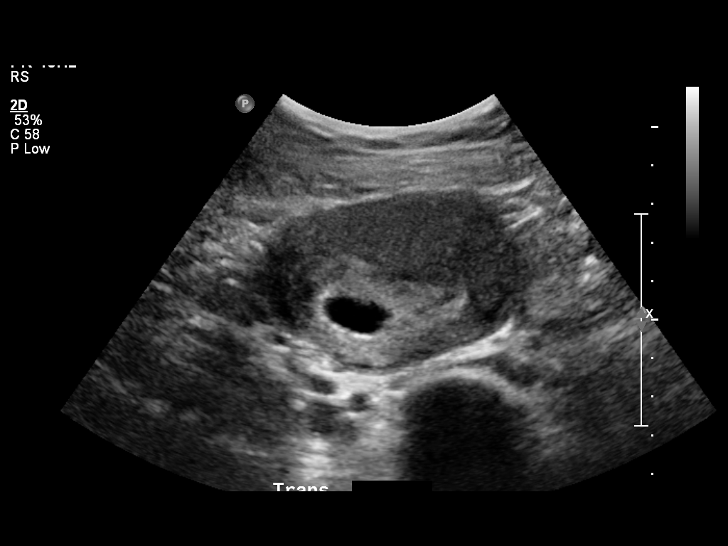
[im 7/26]
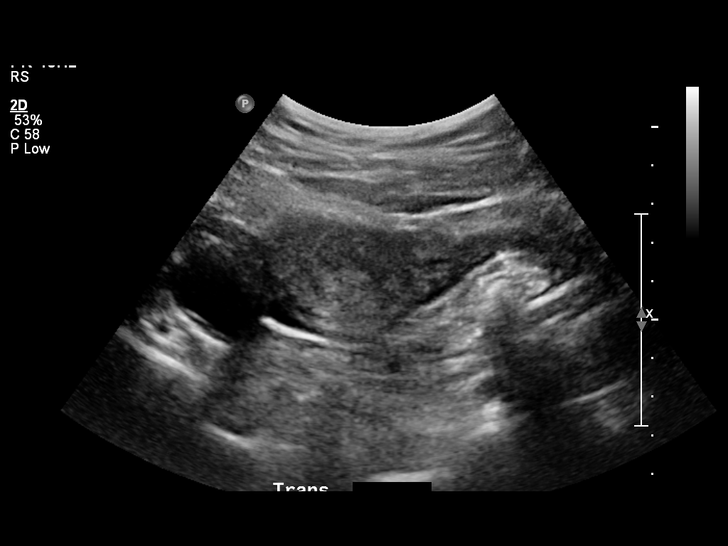
[im 9/26]
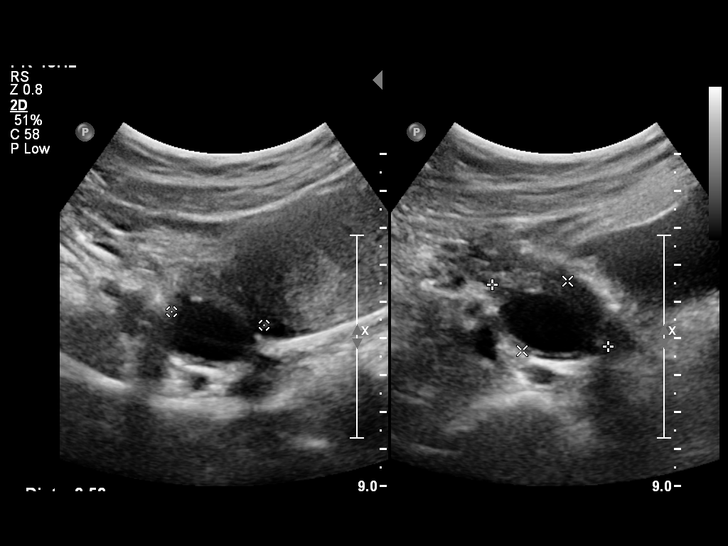
[im 11/26]
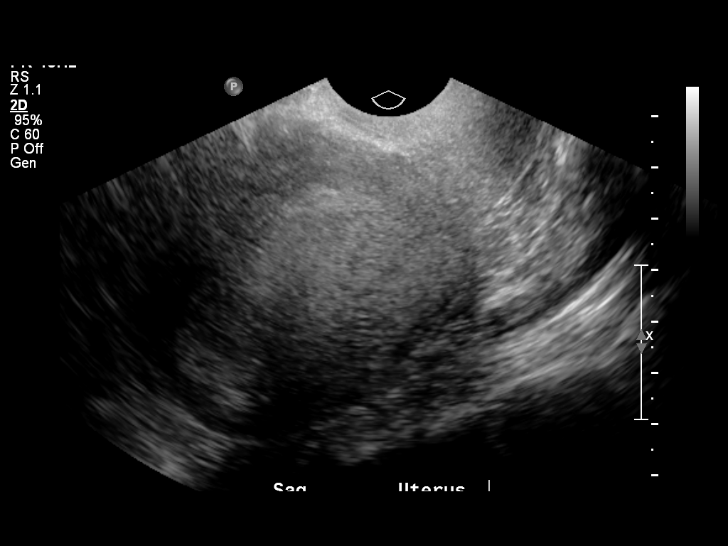
[im 13/26]
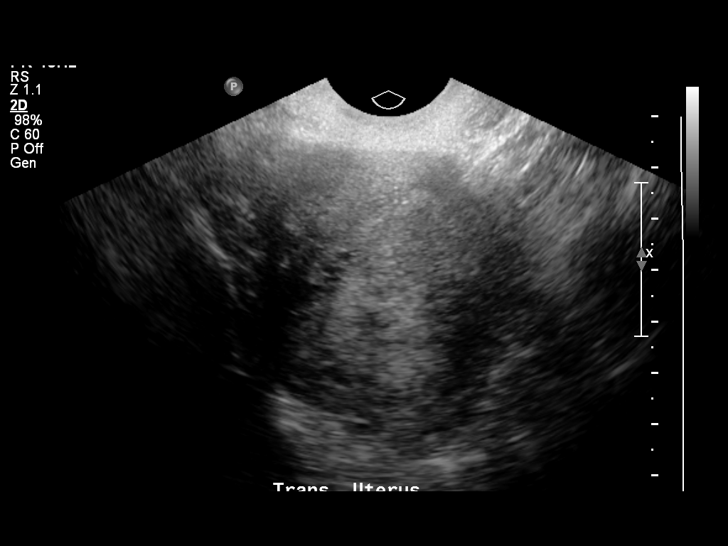
[im 14/26]
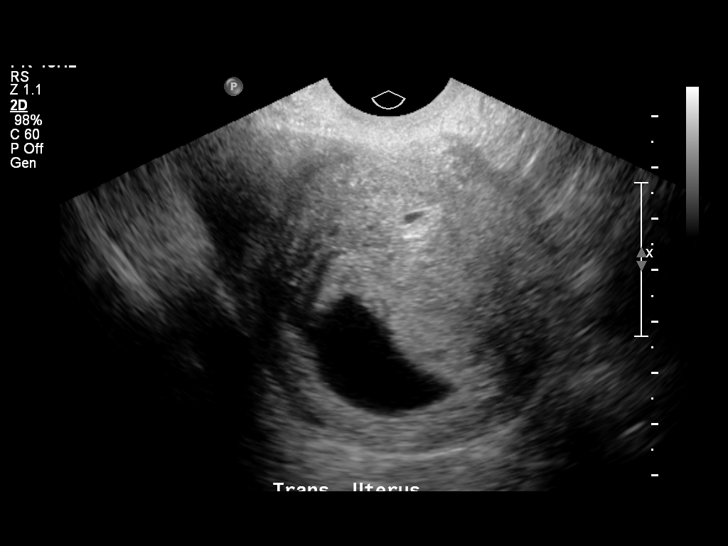
[im 16/26]
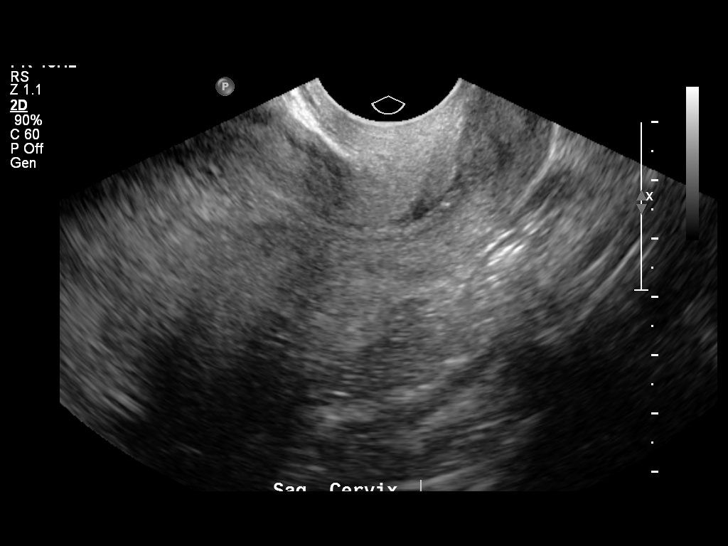
[im 18/26]
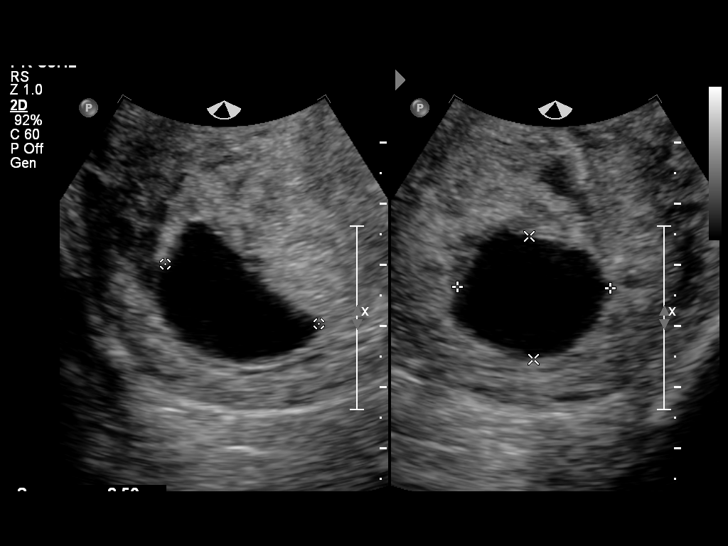
[im 20/26]
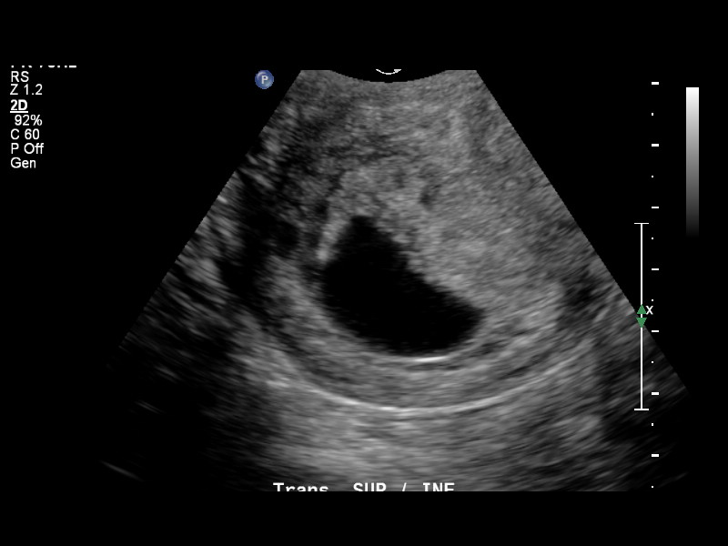
[im 22/26]
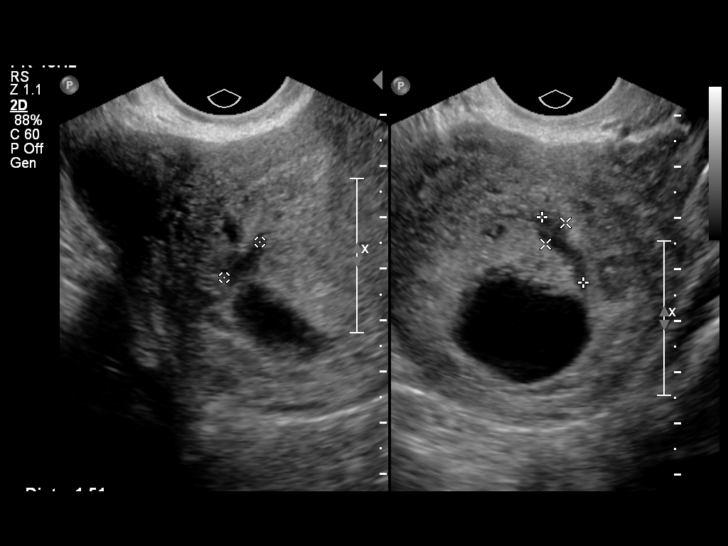
[im 24/26]
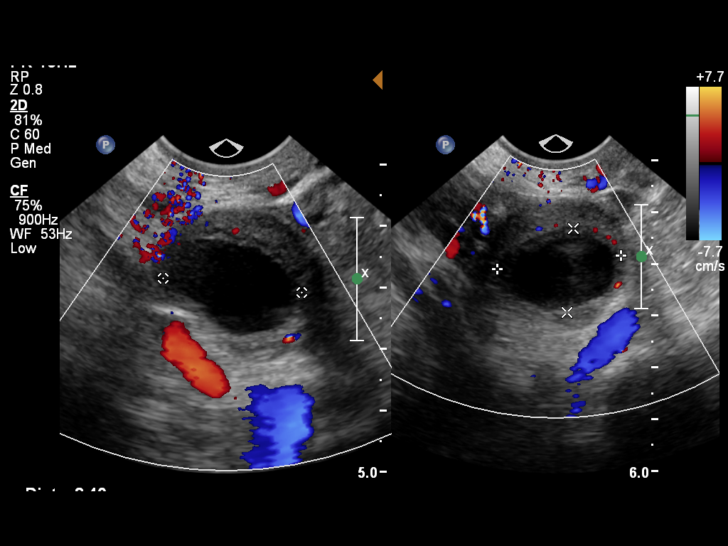
[im 26/26]
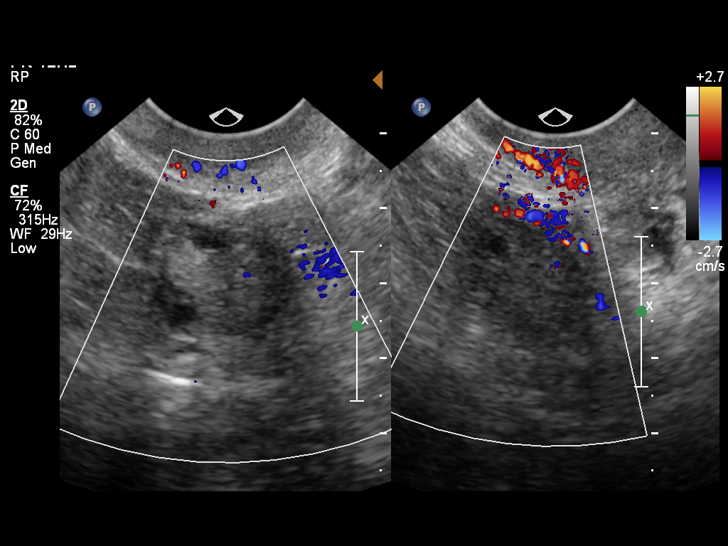

[14 of 26 positions shown; findings below may reference images not displayed]

FINDINGS: Intrauterine gestational sac: Visualized/normal in shape.

Yolk sac:  Not visualized

Embryo:  Not visualized

MSD:  24  mm   7 w   4  d

Maternal uterus/adnexae: Small subchorionic hemorrhage noted. Small
right ovarian corpus luteum cyst also noted. Left ovary appears
normal. No evidence of adnexal mass or free fluid.
IMPRESSION: Findings are suspicious but not yet definitive for failed pregnancy.
Recommend follow-up US in 10-14 days for definitive diagnosis. This
recommendation follows SRU consensus guidelines: Diagnostic Criteria
for Nonviable Pregnancy Early in the First Trimester. N Engl J Med
3247; [DATE].

Small subchorionic hemorrhage.

## 2014-08-27 ENCOUNTER — Ambulatory Visit: Payer: Medicaid Other | Admitting: *Deleted

## 2014-08-27 DIAGNOSIS — R350 Frequency of micturition: Secondary | ICD-10-CM

## 2014-08-27 LAB — POCT URINALYSIS DIP (DEVICE)
Bilirubin Urine: NEGATIVE
GLUCOSE, UA: NEGATIVE mg/dL
Ketones, ur: NEGATIVE mg/dL
Nitrite: NEGATIVE
Protein, ur: NEGATIVE mg/dL
Specific Gravity, Urine: 1.015 (ref 1.005–1.030)
UROBILINOGEN UA: 0.2 mg/dL (ref 0.0–1.0)
pH: 6.5 (ref 5.0–8.0)

## 2014-08-27 NOTE — Progress Notes (Signed)
Pt complains of urinary frequency. Clean catch urine collected. Culture ordered. Christy Gentles. Poe CNM will review results this weekend.

## 2014-08-29 LAB — URINE CULTURE
COLONY COUNT: NO GROWTH
ORGANISM ID, BACTERIA: NO GROWTH

## 2014-09-01 ENCOUNTER — Telehealth: Payer: Self-pay | Admitting: *Deleted

## 2014-09-01 NOTE — Telephone Encounter (Signed)
Pt called nurse line requesting results.  Contacted patient, results given.  No further questions.

## 2014-10-10 ENCOUNTER — Ambulatory Visit: Payer: Medicaid Other | Admitting: Obstetrics & Gynecology

## 2014-12-18 ENCOUNTER — Other Ambulatory Visit: Payer: Self-pay | Admitting: Obstetrics and Gynecology

## 2014-12-19 LAB — CYTOLOGY - PAP

## 2016-04-18 ENCOUNTER — Encounter (HOSPITAL_COMMUNITY): Payer: Self-pay | Admitting: Emergency Medicine

## 2016-04-18 ENCOUNTER — Ambulatory Visit (HOSPITAL_COMMUNITY)
Admission: EM | Admit: 2016-04-18 | Discharge: 2016-04-18 | Disposition: A | Discharge status: Home / Self Care | Payer: Medicaid Other | Attending: Family Medicine | Admitting: Family Medicine

## 2016-04-18 DIAGNOSIS — M79605 Pain in left leg: Secondary | ICD-10-CM | POA: Diagnosis not present

## 2016-04-18 DIAGNOSIS — M79604 Pain in right leg: Secondary | ICD-10-CM

## 2016-04-18 LAB — POCT URINALYSIS DIP (DEVICE)
BILIRUBIN URINE: NEGATIVE
Glucose, UA: NEGATIVE mg/dL
Ketones, ur: NEGATIVE mg/dL
NITRITE: NEGATIVE
PH: 6.5 (ref 5.0–8.0)
PROTEIN: NEGATIVE mg/dL
Specific Gravity, Urine: 1.01 (ref 1.005–1.030)
UROBILINOGEN UA: 0.2 mg/dL (ref 0.0–1.0)

## 2016-04-18 LAB — POCT PREGNANCY, URINE: PREG TEST UR: NEGATIVE

## 2016-04-18 MED ORDER — MELOXICAM 7.5 MG PO TABS: 7.5000 mg | ORAL_TABLET | Freq: Two times a day (BID) | ORAL | Status: AC

## 2016-04-18 NOTE — ED Provider Notes (Signed)
CSN: [id number]     Arrival date & time 04/18/16  1836 History   First MD Initiated Contact with Patient 04/18/16 1853     Chief Complaint  Patient presents with  . Headache  . Leg Pain   (Consider location/radiation/quality/duration/timing/severity/associated sxs/prior Treatment) Patient is a 35 y.o. female presenting with leg pain. The history is provided by the patient.  Leg Pain Location:  Leg Time since incident:  1 week Injury: no   Leg location:  L upper leg and R upper leg Pain details:    Quality:  Cramping   Severity:  Moderate   Progression:  Worsening (has seen rheumatology with neg eval.wants ortho) Chronicity:  New Dislocation: no   Prior injury to area:  No Associated symptoms: back pain   Associated symptoms: no decreased ROM, no fever, no neck pain, no numbness, no stiffness, no swelling and no tingling   Risk factors: no obesity     Past Medical History  Diagnosis Date  . Thyroid disease   . Anxiety   . Hx of preeclampsia, prior pregnancy, currently pregnant 2004  . PROM (premature rupture of membranes) 2004  . Abnormal Pap smear    Past Surgical History  Procedure Laterality Date  . Wisdom tooth extraction    . Abortion      3 previous abortions   Family History  Problem Relation Age of Onset  . Hyperlipidemia Father   . Liver disease Mother   . Thyroid disease Mother   . Fibroids Mother   . Diabetes Paternal Grandmother   . Cancer Paternal Grandmother   . Alzheimer's disease Paternal Grandmother   . Diabetes Paternal Grandfather   . Alzheimer's disease Paternal Grandfather   . Thyroid disease Sister    Social History  Substance Use Topics  . Smoking status: Never Smoker   . Smokeless tobacco: Never Used  . Alcohol Use: No   OB History    Gravida Para Term Preterm AB TAB SAB Ectopic Multiple Living   8 3 3  4 4    3      Review of Systems  Constitutional: Negative.  Negative for fever.  Gastrointestinal: Negative.   Genitourinary:  Negative.   Musculoskeletal: Positive for myalgias and back pain. Negative for gait problem, stiffness and neck pain.  All other systems reviewed and are negative.   Allergies  Review of patient's allergies indicates no known allergies.  Home Medications   Prior to Admission medications   Medication Sig Start Date End Date Taking? Authorizing Provider  Elastic Bandages & Supports (WRIST SPLINT) MISC 1 Device by Does not apply route daily. 10/09/13   Lisa A Leftwich-Kirby, CNM  ibuprofen (ADVIL,MOTRIN) 600 MG tablet Take 1 tablet (600 mg total) by mouth every 6 (six) hours as needed. 04/27/14   Wilmer FloorLisa A Leftwich-Kirby, CNM  meloxicam (MOBIC) 7.5 MG tablet Take 1 tablet (7.5 mg total) by mouth 2 (two) times daily after a meal. 04/18/16   Linna HoffJames D Filomena Pokorney, MD  oxyCODONE-acetaminophen (PERCOCET/ROXICET) 5-325 MG per tablet Take 1 tablet by mouth every 4 (four) hours as needed. 04/26/14   Deirdre Colin Mulders Poe, CNM  Prenatal Vit-Fe Fumarate-FA (PRENATAL MULTIVITAMIN) TABS tablet Take 1 tablet by mouth daily at 12 noon.    Historical Provider, MD   Meds Ordered and Administered this Visit  Medications - No data to display  BP 129/80 mmHg  Pulse 85  Temp(Src) 99 F (37.2 C) (Oral)  Resp 12  SpO2 100%  LMP 03/30/2016 No data found.  Physical Exam  Constitutional: She is oriented to person, place, and time. She appears well-developed and well-nourished. No distress.  Abdominal: Soft. Bowel sounds are normal. There is no tenderness.  Musculoskeletal: She exhibits tenderness. She exhibits no edema.  Neurological: She is alert and oriented to person, place, and time.  Skin: Skin is warm and dry.  Nursing note and vitals reviewed.   ED Course  Procedures (including critical care time)  Labs Review Labs Reviewed  POCT URINALYSIS DIP (DEVICE) - Abnormal; Notable for the following:    Hgb urine dipstick SMALL (*)    Leukocytes, UA TRACE (*)    All other components within normal limits  POCT  PREGNANCY, URINE    Imaging Review No results found.   Visual Acuity Review  Right Eye Distance:   Left Eye Distance:   Bilateral Distance:    Right Eye Near:   Left Eye Near:    Bilateral Near:         MDM   1. Leg pain, bilateral        Linna Hoff, MD 04/18/16 1932

## 2016-04-18 NOTE — ED Notes (Signed)
Patient has multiple complaints.  Symptoms for one week.  Complains of bilateral thigh pain, low back pain, low abdominal pain, and complains of headache.  Patient has had similar episode in the past and had tests.  No diagnosis, but many things ruled out.

## 2016-04-18 NOTE — Discharge Instructions (Signed)
Use medicine as prescribed, see orthopedist if further problems. °

## 2020-06-02 ENCOUNTER — Ambulatory Visit: Payer: Self-pay | Attending: Internal Medicine

## 2020-06-02 DIAGNOSIS — Z23 Encounter for immunization: Secondary | ICD-10-CM

## 2020-06-02 NOTE — Progress Notes (Signed)
   Covid-19 Vaccination Clinic  Name:  Lai Hendriks    MRN: [id number] DOB: 1981/07/13  06/02/2020  Ms. Kregel was observed post Covid-19 immunization for 15 minutes without incident. She was provided with Vaccine Information Sheet and instruction to access the V-Safe system.   Ms. Zeiter was instructed to call 911 with any severe reactions post vaccine: Marland Kitchen Difficulty breathing  . Swelling of face and throat  . A fast heartbeat  . A bad rash all over body  . Dizziness and weakness   Immunizations Administered    Name Date Dose VIS Date Route   Pfizer COVID-19 Vaccine 06/02/2020 11:05 AM 0.3 mL 11/27/2018 Intramuscular   Manufacturer: [employer], [employer]   Lot: [id number]   NDC: [address]-4

## 2020-06-23 ENCOUNTER — Ambulatory Visit: Payer: Self-pay | Attending: Internal Medicine

## 2020-06-23 ENCOUNTER — Ambulatory Visit: Payer: Self-pay

## 2020-06-23 DIAGNOSIS — Z23 Encounter for immunization: Secondary | ICD-10-CM

## 2020-06-23 NOTE — Progress Notes (Signed)
   Covid-19 Vaccination Clinic  Name:  Kathleen Ward    MRN: [id number] DOB: 1981/09/21  06/23/2020  Kathleen Ward was observed post Covid-19 immunization for 15 minutes without incident. She was provided with Vaccine Information Sheet and instruction to access the V-Safe system.   Kathleen Ward was instructed to call 911 with any severe reactions post vaccine: Marland Kitchen Difficulty breathing  . Swelling of face and throat  . A fast heartbeat  . A bad rash all over body  . Dizziness and weakness   Immunizations Administered    Name Date Dose VIS Date Route   Pfizer COVID-19 Vaccine 06/23/2020 11:07 AM 0.3 mL 11/27/2018 Intramuscular   Manufacturer: [employer], [employer]   Lot: [id number]   NDC: [address]-0

## 2022-06-04 ENCOUNTER — Ambulatory Visit
Admission: RE | Admit: 2022-06-04 | Discharge: 2022-06-04 | Disposition: A | Discharge status: Home / Self Care | Payer: Self-pay | Source: Ambulatory Visit | Attending: Urgent Care | Admitting: Urgent Care

## 2022-06-04 VITALS — BP 129/82 | HR 86 | Temp 98.2°F | Resp 20

## 2022-06-04 DIAGNOSIS — R140 Abdominal distension (gaseous): Secondary | ICD-10-CM

## 2022-06-04 DIAGNOSIS — R1010 Upper abdominal pain, unspecified: Secondary | ICD-10-CM

## 2022-06-04 DIAGNOSIS — R109 Unspecified abdominal pain: Secondary | ICD-10-CM

## 2022-06-04 MED ORDER — ESOMEPRAZOLE MAGNESIUM 20 MG PO CPDR: 20.0000 mg | DELAYED_RELEASE_CAPSULE | Freq: Every day | ORAL | 1 refills | Status: AC

## 2022-06-04 MED ORDER — FAMOTIDINE 20 MG PO TABS: 20.0000 mg | ORAL_TABLET | Freq: Two times a day (BID) | ORAL | 0 refills | Status: AC

## 2022-06-04 NOTE — ED Triage Notes (Addendum)
Pt. States a few weeks ago her back pain started in the upper and lower parts of her left sided and it has been unrelieved. A few days later she states abdominal pain started and was accompanied by bloating, heartburn, a canker sore on the right side of her mouth and  bad breathe. Pt. also states she has noticed her bowel movements turning yellow.

## 2022-06-04 NOTE — ED Provider Notes (Signed)
[facility]  Note:  This document was prepared using Conservation officer, historic buildings and may include unintentional dictation errors.  MRN: [id number] DOB: 1981-03-14  Subjective:   Kathleen Ward is a 41 y.o. female presenting for several week history of pan positive review of systems.  Patient reports upper abdominal pain that have been left-sided abdominal pain that wraps around toward her back.  Reports pain along her midline of her back.  She also reports bloating, heartburn, sour brash, burning sensation of her throat.  She is also had a canker sore.  Reports that she has regular bowel movements but has concerns about the yellow color of her stools.  Reports general tiredness.  Would like a complete blood test panel including a check on her kidneys, liver, gallbladder, pancreas, general blood work.  Does not have a PCP.  Denies fever, nausea, vomiting.  However, she does report that she has decreased appetite.  This is in spite of changing her diet to healthy foods.  No alcohol use, marijuana use.  No drug use.  Reports a family history of a fatty liver with her mom.  One of her sister also had gallbladder disease.  Patient denies bloody stools, clay colored stools, dark stools, chest pain, shortness of breath.  She does have a history of anxiety.  This is not currently being treated.  No current facility-administered medications for this encounter.  Current Outpatient Medications:    Elastic Bandages & Supports (WRIST SPLINT) MISC, 1 Device by Does not apply route daily., Disp: 1 each, Rfl: 0   ibuprofen (ADVIL,MOTRIN) 600 MG tablet, Take 1 tablet (600 mg total) by mouth every 6 (six) hours as needed., Disp: 30 tablet, Rfl: 1   meloxicam (MOBIC) 7.5 MG tablet, Take 1 tablet (7.5 mg total) by mouth 2 (two) times daily after a meal., Disp: 30 tablet, Rfl: 1   oxyCODONE-acetaminophen (PERCOCET/ROXICET) 5-325 MG per tablet, Take 1 tablet by mouth every 4 (four) hours as  needed., Disp: 10 tablet, Rfl: 0   Prenatal Vit-Fe Fumarate-FA (PRENATAL MULTIVITAMIN) TABS tablet, Take 1 tablet by mouth daily at 12 noon., Disp: , Rfl:    No Known Allergies  Past Medical History:  Diagnosis Date   Abnormal Pap smear    Anxiety    Hx of preeclampsia, prior pregnancy, currently pregnant 2004   PROM (premature rupture of membranes) 2004   Thyroid disease      Past Surgical History:  Procedure Laterality Date   abortion     3 previous abortions   WISDOM TOOTH EXTRACTION      Family History  Problem Relation Age of Onset   Liver disease Mother    Thyroid disease Mother    Fibroids Mother    Diverticulitis Mother    Hyperlipidemia Father    Thyroid disease Sister    Diabetes Paternal Grandmother    Cancer Paternal Grandmother    Alzheimer's disease Paternal Grandmother    Diabetes Paternal Grandfather    Alzheimer's disease Paternal Grandfather     Social History   Tobacco Use   Smoking status: Never   Smokeless tobacco: Never  Substance Use Topics   Alcohol use: No   Drug use: No    ROS   Objective:   Vitals: BP 129/82   Pulse 86   Temp 98.2 F (36.8 C)   Resp 20   LMP 05/26/2022 (Exact Date)   SpO2 96%   Physical Exam Constitutional:      General:  She is not in acute distress.    Appearance: Normal appearance. She is well-developed. She is not ill-appearing, toxic-appearing or diaphoretic.  HENT:     Head: Normocephalic and atraumatic.     Nose: Nose normal.     Mouth/Throat:     Mouth: Mucous membranes are moist.     Pharynx: Oropharynx is clear. No pharyngeal swelling, oropharyngeal exudate, posterior oropharyngeal erythema or uvula swelling.     Tonsils: No tonsillar exudate or tonsillar abscesses. 0 on the right. 0 on the left.  Eyes:     General: No scleral icterus.       Right eye: No discharge.        Left eye: No discharge.     Extraocular Movements: Extraocular movements intact.     Conjunctiva/sclera: Conjunctivae  normal.  Cardiovascular:     Rate and Rhythm: Normal rate and regular rhythm.     Heart sounds: Normal heart sounds. No murmur heard.    No friction rub. No gallop.  Pulmonary:     Effort: Pulmonary effort is normal. No respiratory distress.     Breath sounds: No stridor. No wheezing, rhonchi or rales.  Chest:     Chest wall: No tenderness.  Abdominal:     General: Bowel sounds are normal. There is no distension.     Palpations: Abdomen is soft. There is no mass.     Tenderness: There is abdominal tenderness in the epigastric area, periumbilical area, left upper quadrant and left lower quadrant. There is no right CVA tenderness, left CVA tenderness, guarding or rebound.  Skin:    General: Skin is warm and dry.  Neurological:     General: No focal deficit present.     Mental Status: She is alert and oriented to person, place, and time.  Psychiatric:        Mood and Affect: Mood normal.        Behavior: Behavior normal.        Thought Content: Thought content normal.        Judgment: Judgment normal.     Assessment and Plan :   PDMP not reviewed this encounter.  1. Upper abdominal pain   2. Left sided abdominal pain   3. Abdominal bloating    No signs of an acute abdomen.  I advised patient that she would benefit from establishing care with a PCP for complete blood work and screening that she is requesting.  Her symptoms are more consistent with acid reflux and therefore I recommended managing for this with Nexium and famotidine.  Also provided her with information to a gastroenterologist in the event that she wants to pursue further work-up.  I advised that if her abdominal pain gets worse that she should definitely go to the emergency room.  I deferred laboratory testing for her lipase level, hepatitis panel, gallbladder enzymes given lack of pain on the right side of her abdomen and the general risk factors that she does not have for pancreatitis. Counseled patient on potential  for adverse effects with medications prescribed/recommended today, ER and return-to-clinic precautions discussed, patient verbalized understanding.    Wallis Bamberg, PA-C 06/04/22 1250
